# Patient Record
Sex: Male | Born: 1970
Health system: Southern US, Community
[De-identification: ages and names within clinical notes are randomized; demographics above are authoritative.]

## PROBLEM LIST (undated history)

## (undated) DIAGNOSIS — I10 Essential (primary) hypertension: Secondary | ICD-10-CM

## (undated) DIAGNOSIS — J45909 Unspecified asthma, uncomplicated: Secondary | ICD-10-CM

## (undated) HISTORY — PX: NO PAST SURGERIES: SHX2092

## (undated) HISTORY — DX: Essential (primary) hypertension: I10

## (undated) HISTORY — DX: Unspecified asthma, uncomplicated: J45.909

---

## 1999-02-28 ENCOUNTER — Emergency Department (HOSPITAL_COMMUNITY): Admission: EM | Admit: 1999-02-28 | Discharge: 1999-02-28 | Payer: Self-pay | Admitting: Emergency Medicine

## 2002-06-02 ENCOUNTER — Encounter: Payer: Self-pay | Admitting: Emergency Medicine

## 2002-06-02 ENCOUNTER — Encounter: Admission: RE | Admit: 2002-06-02 | Discharge: 2002-06-02 | Payer: Self-pay | Admitting: Emergency Medicine

## 2005-12-21 ENCOUNTER — Emergency Department (HOSPITAL_COMMUNITY): Admission: EM | Admit: 2005-12-21 | Discharge: 2005-12-21 | Payer: Self-pay | Admitting: Emergency Medicine

## 2006-04-22 ENCOUNTER — Ambulatory Visit: Payer: Self-pay | Admitting: Family Medicine

## 2007-08-02 IMAGING — CT CT MAXILLOFACIAL W/O CM
5 of 9 series · 18 of 47 positions shown, 19 images · IV contrast (agent unspecified)
Comparison: none

CLINICAL DATA: Softball injury to head and face.  Pain and swelling.  
 HEAD CT WITHOUT CONTRAST:
TECHNIQUE: Contiguous axial images were obtained from the base of the skull through the vertex according to standard protocol without contrast.
TECHNIQUE: Axial and coronal CT imaging was performed through the maxillofacial structures.  No intravenous contrast was administered.
TECHNIQUE: Multidetector CT imaging of the cervical spine was performed.  Multiplanar CT image reconstructions were also generated.

[Series 5: supine facial bones · axial · 0.33mm/px · z∈[-240,-190]mm · 2 of 80 slices shown]
[im 20/80  bone]
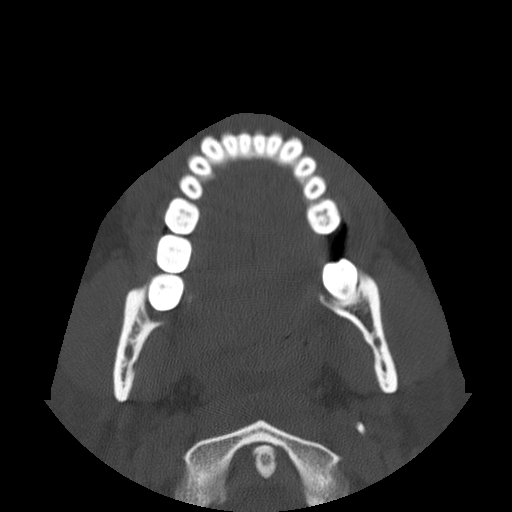
[im 40/80  bone]
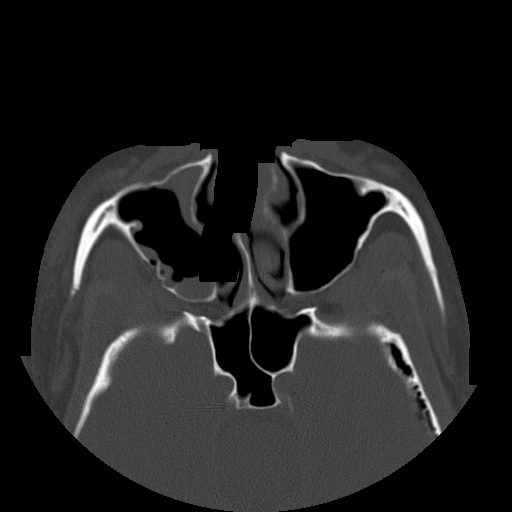

[Series 7: recon 3: supine facial bones · axial · 0.33mm/px · z∈[-264,-115]mm · 7 of 159 slices shown]
[im 20/159  bone]
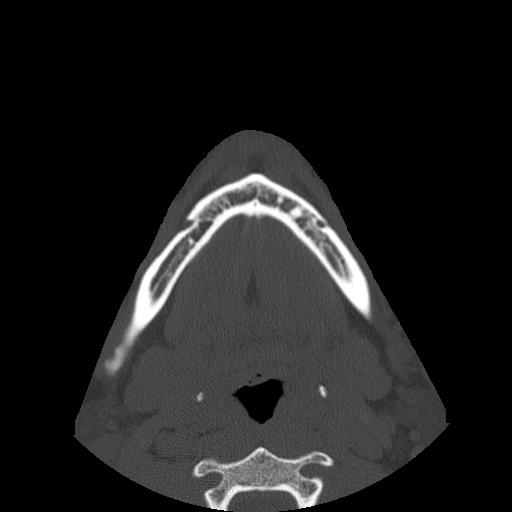
[im 40/159  bone]
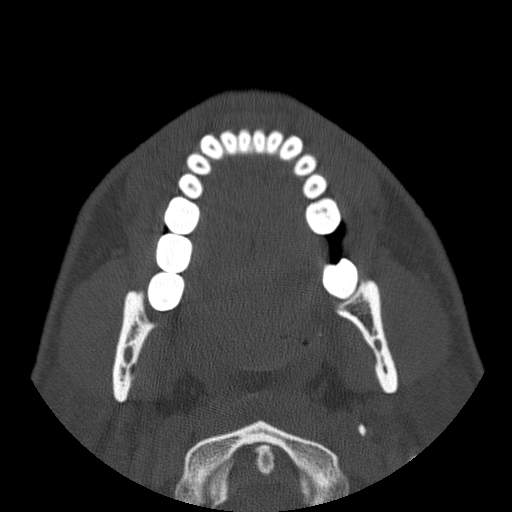
[im 60/159  bone]
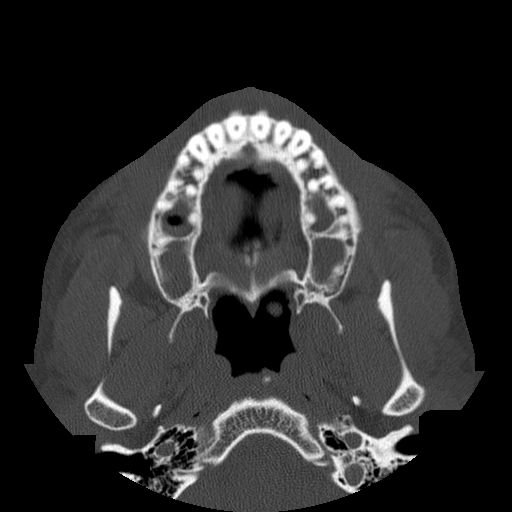
[im 80/159  bone]
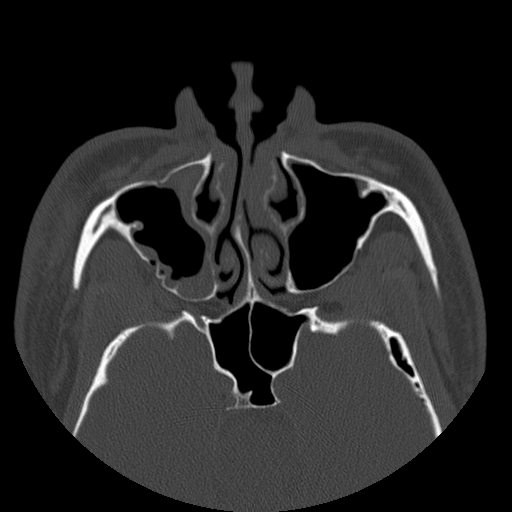
[im 99/159  bone]
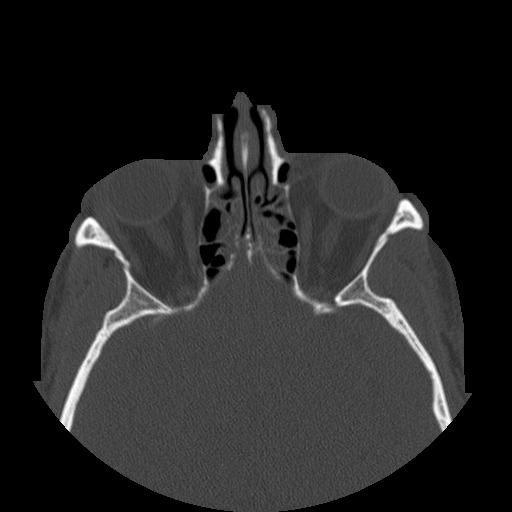
[im 119/159  bone]
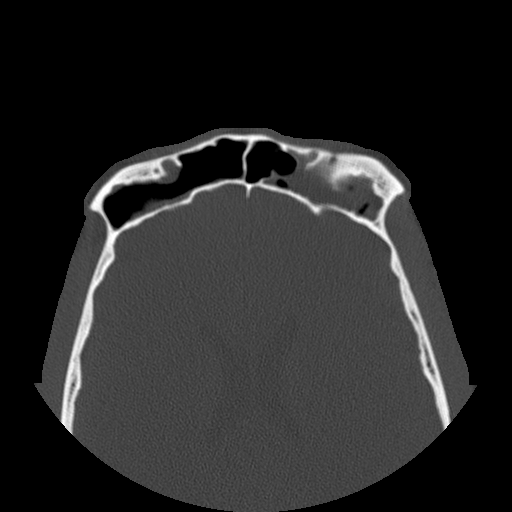
[im 139/159  bone]
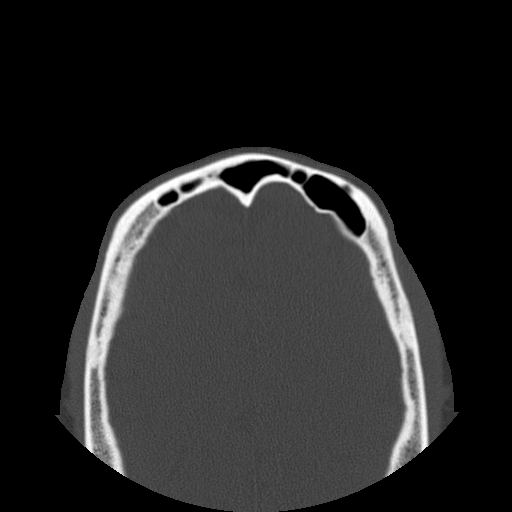

[Series 8: cervical spine · axial · 0.33mm/px · z∈[-320,-210]mm · 3 of 89 slices shown, 4 images]
[im 23/89  brain]
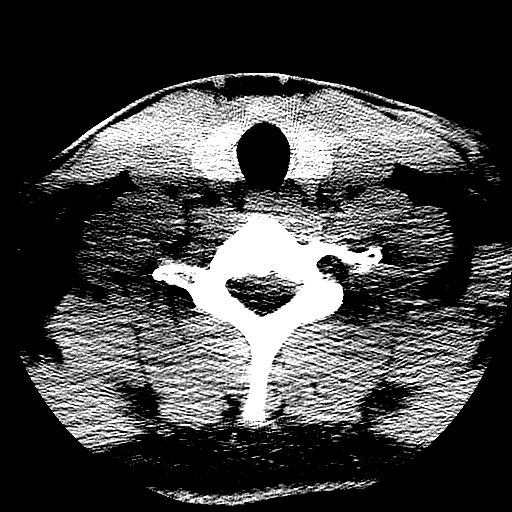
[im 23/89  bone]
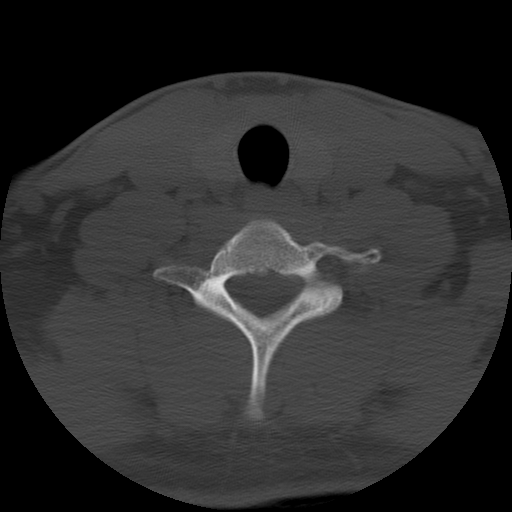
[im 45/89  bone]
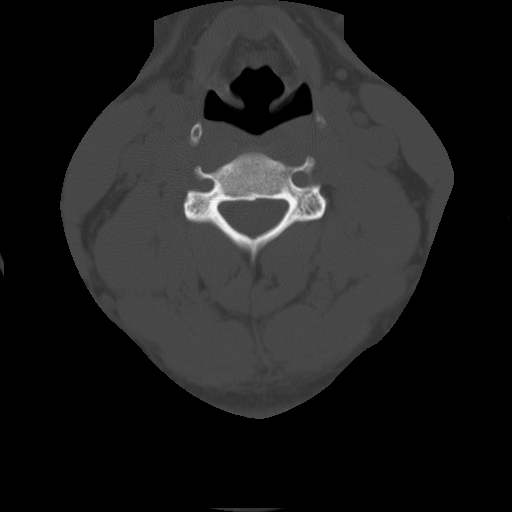
[im 67/89  bone]
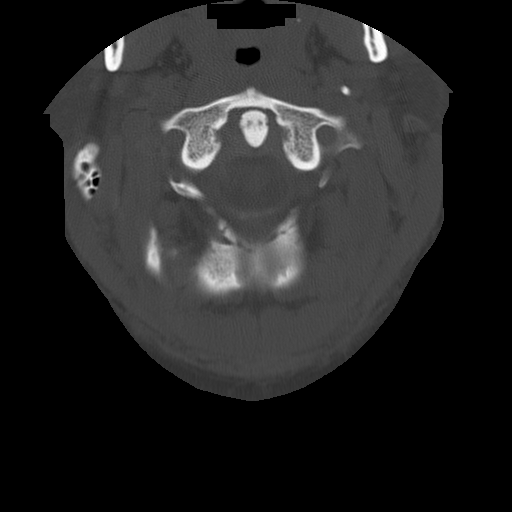

[Series 1000: reformatted · coronal · 0.43mm/px · 3 of 63 slices shown (1 of 2)]
[im 21/63  bone]
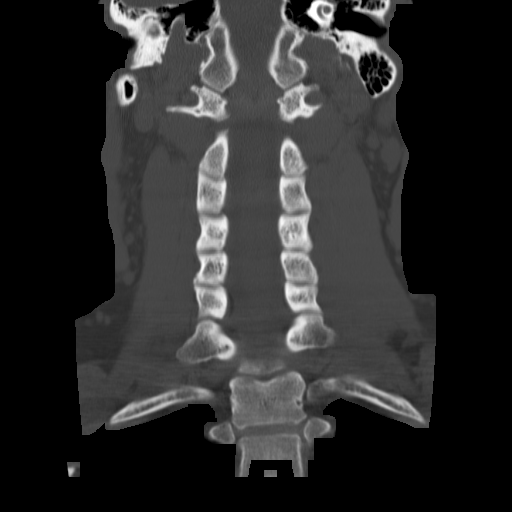
[im 28/63  bone]
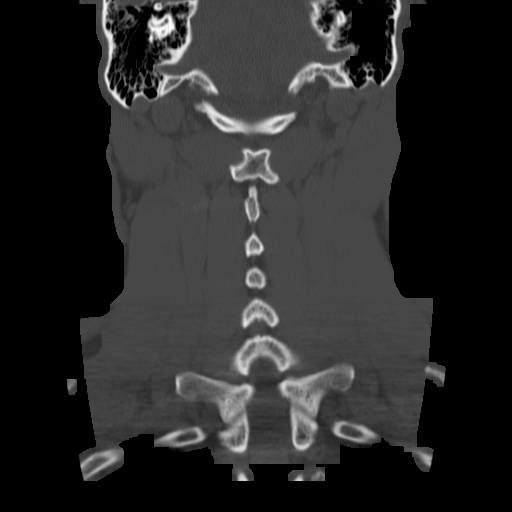
[im 35/63  bone]
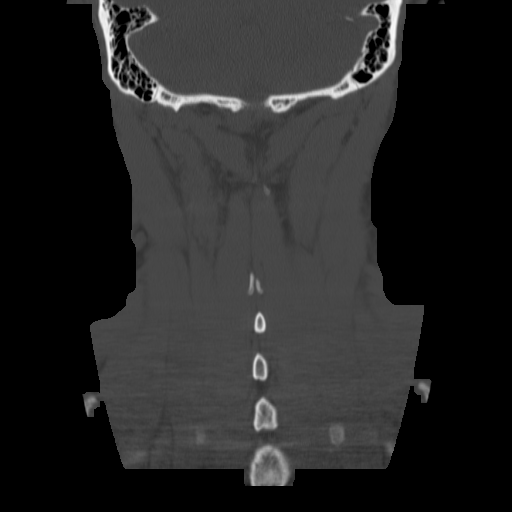

[Series 1001: reformatted · sagittal · 0.43mm/px · 3 of 63 slices shown (2 of 2)]
[im 21/63  bone]
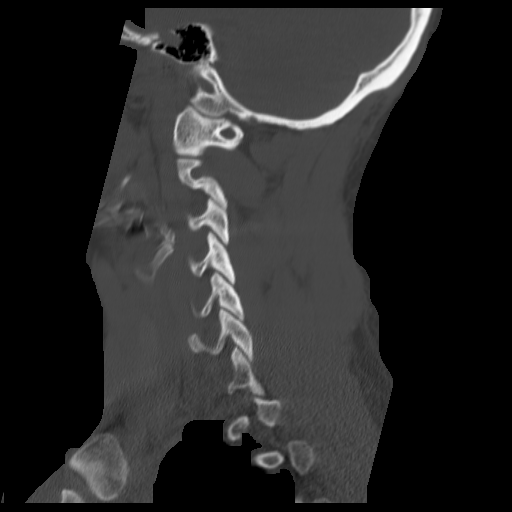
[im 32/63  bone]
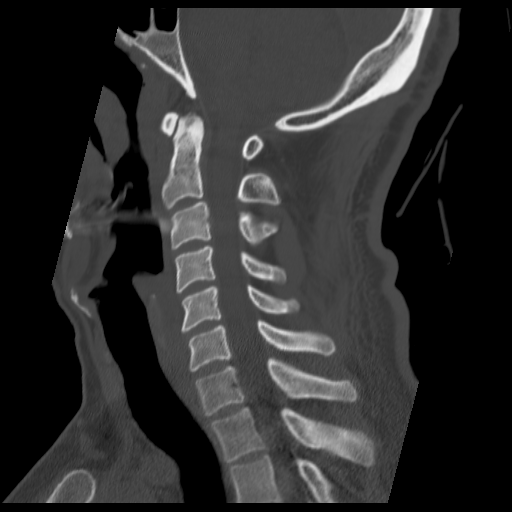
[im 42/63  bone]
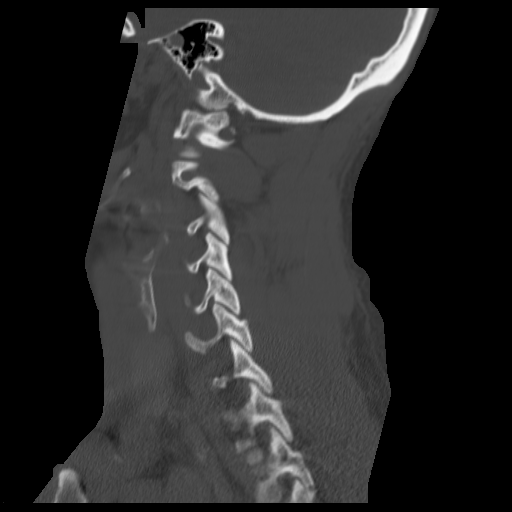

[18 of 47 positions shown; findings below may reference images not displayed]

FINDINGS: There is no evidence of intracranial hemorrhage, brain edema, acute infarct, mass lesion, or mass effect.  No other intra-axial abnormalities are seen, and the ventricles are within normal limits.  No abnormal extra-axial fluid collections or masses are identified.  No skull abnormalities are noted.
IMPRESSION: Negative non-contrast head CT.
 MAXILLOFACIAL CT WITHOUT CONTRAST:
FINDINGS: Fractures are seen involving the right zygomatic arch, lateral and anterior walls of the right maxillary sinus and the lateral wall of the right orbit consistent with a tripod fracture.  The globes and other intraorbital structures are normal in appearance.  Small amount of blood is seen within the right maxillary sinus.  Mucosal thickening is seen involving the maxillary and the ethmoid sinuses, as well as the frontal sinuses, consistent with chronic sinusitis.
IMPRESSION: 1.  Right-sided tripod fracture with involvement of the right zygomatic arch, lateral and anterior wall of the right maxillary sinus and lateral wall of the orbit.  
 2.  Chronic pansinusitis.  
 CERVICAL SPINE CT WITHOUT CONTRAST:
FINDINGS: There is no evidence of cervical spine fracture.  Spinal alignment is normal.  No other significant bone abnormalities are identified.
IMPRESSION: No evidence of cervical spine fracture or subluxation.

## 2019-10-06 ENCOUNTER — Telehealth: Payer: Self-pay

## 2019-10-06 NOTE — Telephone Encounter (Signed)
Called patient to do their pre-visit COVID screening.  Unable to do prescreening. Received an error message when trying to call patient's number.

## 2019-10-07 NOTE — Patient Instructions (Addendum)
Thank you for choosing Primary Care at [facility] to be your medical home!    Roberto Henderson was seen by Roberto Schools, DO today.   Roberto Henderson primary care provider is Roberto Myron, DO.   For the best care possible, you should try to see Roberto Myron, DO whenever you come to the clinic.   We look forward to seeing you again soon!  If you have any questions about your visit today, please call us at [phone] or feel free to reach your primary care provider via [facility].       DASH Eating Plan DASH stands for "Dietary Approaches to Stop Hypertension." The DASH eating plan is a healthy eating plan that has been shown to reduce high blood pressure (hypertension). It may also reduce your risk for type 2 diabetes, heart disease, and stroke. The DASH eating plan may also help with weight loss. What are tips for following this plan?  General guidelines  Avoid eating more than 2,300 mg (milligrams) of salt (sodium) a day. If you have hypertension, you may need to reduce your sodium intake to 1,500 mg a day.  Limit alcohol intake to no more than 1 drink a day for nonpregnant women and 2 drinks a day for men. One drink equals 12 oz of beer, 5 oz of wine, or 1 oz of hard liquor.  Work with your health care provider to maintain a healthy body weight or to lose weight. Ask what an ideal weight is for you.  Get at least 30 minutes of exercise that causes your heart to beat faster (aerobic exercise) most days of the week. Activities may include walking, swimming, or biking.  Work with your health care provider or diet and nutrition specialist (dietitian) to adjust your eating plan to your individual calorie needs. Reading food labels   Check food labels for the amount of sodium per serving. Choose foods with less than 5 percent of the Daily Value of sodium. Generally, foods with less than 300 mg of sodium per serving fit into this eating plan.  To find whole grains,  look for the word "whole" as the first word in the ingredient list. Shopping  Buy products labeled as "low-sodium" or "no salt added."  Buy fresh foods. Avoid canned foods and premade or frozen meals. Cooking  Avoid adding salt when cooking. Use salt-free seasonings or herbs instead of table salt or sea salt. Check with your health care provider or pharmacist before using salt substitutes.  Do not fry foods. Cook foods using healthy methods such as baking, boiling, grilling, and broiling instead.  Cook with heart-healthy oils, such as olive, canola, soybean, or sunflower oil. Meal planning  Eat a balanced diet that includes: ? 5 or more servings of fruits and vegetables each day. At each meal, try to fill half of your plate with fruits and vegetables. ? Up to 6-8 servings of whole grains each day. ? Less than 6 oz of lean meat, poultry, or fish each day. A 3-oz serving of meat is about the same size as a deck of cards. One egg equals 1 oz. ? 2 servings of low-fat dairy each day. ? A serving of nuts, seeds, or beans 5 times each week. ? Heart-healthy fats. Healthy fats called Omega-3 fatty acids are found in foods such as flaxseeds and coldwater fish, like sardines, salmon, and mackerel.  Limit how much you eat of the following: ? Canned or prepackaged foods. ? Food that is  high in trans fat, such as fried foods. ? Food that is high in saturated fat, such as fatty meat. ? Sweets, desserts, sugary drinks, and other foods with added sugar. ? Full-fat dairy products.  Do not salt foods before eating.  Try to eat at least 2 vegetarian meals each week.  Eat more home-cooked food and less restaurant, buffet, and fast food.  When eating at a restaurant, ask that your food be prepared with less salt or no salt, if possible. What foods are recommended? The items listed may not be a complete list. Talk with your dietitian about what dietary choices are best for you. Grains Whole-grain or  whole-wheat bread. Whole-grain or whole-wheat pasta. Brown rice. Roberto Henderson. Bulgur. Whole-grain and low-sodium cereals. Pita bread. Low-fat, low-sodium crackers. Whole-wheat flour tortillas. Vegetables Fresh or frozen vegetables (raw, steamed, roasted, or grilled). Low-sodium or reduced-sodium tomato and vegetable juice. Low-sodium or reduced-sodium tomato sauce and tomato paste. Low-sodium or reduced-sodium canned vegetables. Fruits All fresh, dried, or frozen fruit. Canned fruit in natural juice (without added sugar). Meat and other protein foods Skinless chicken or [place]. Ground chicken or [place]. Pork with fat trimmed off. Fish and seafood. Egg whites. Dried beans, peas, or lentils. Unsalted nuts, nut butters, and seeds. Unsalted canned beans. Lean cuts of beef with fat trimmed off. Low-sodium, lean deli meat. Dairy Low-fat (1%) or fat-free (skim) milk. Fat-free, low-fat, or reduced-fat cheeses. Nonfat, low-sodium ricotta or cottage cheese. Low-fat or nonfat yogurt. Low-fat, low-sodium cheese. Fats and oils Soft margarine without trans fats. Vegetable oil. Low-fat, reduced-fat, or light mayonnaise and salad dressings (reduced-sodium). Canola, safflower, olive, soybean, and sunflower oils. Avocado. Seasoning and other foods Herbs. Spices. Seasoning mixes without salt. Unsalted popcorn and pretzels. Fat-free sweets. What foods are not recommended? The items listed may not be a complete list. Talk with your dietitian about what dietary choices are best for you. Grains Baked goods made with fat, such as croissants, muffins, or some breads. Dry pasta or rice meal packs. Vegetables Creamed or fried vegetables. Vegetables in a cheese sauce. Regular canned vegetables (not low-sodium or reduced-sodium). Regular canned tomato sauce and paste (not low-sodium or reduced-sodium). Regular tomato and vegetable juice (not low-sodium or reduced-sodium). Roberto Henderson. Olives. Fruits Canned fruit in a light  or heavy syrup. Fried fruit. Fruit in cream or butter sauce. Meat and other protein foods Fatty cuts of meat. Ribs. Fried meat. Roberto Henderson. Sausage. Bologna and other processed lunch meats. Salami. Fatback. Hotdogs. Bratwurst. Salted nuts and seeds. Canned beans with added salt. Canned or smoked fish. Whole eggs or egg yolks. Chicken or [place] with skin. Dairy Whole or 2% milk, cream, and half-and-half. Whole or full-fat cream cheese. Whole-fat or sweetened yogurt. Full-fat cheese. Nondairy creamers. Whipped toppings. Processed cheese and cheese spreads. Fats and oils Butter. Stick margarine. Lard. Shortening. Ghee. Bacon fat. Tropical oils, such as coconut, palm kernel, or palm oil. Seasoning and other foods Salted popcorn and pretzels. Onion salt, garlic salt, seasoned salt, table salt, and sea salt. Worcestershire sauce. Tartar sauce. Barbecue sauce. Teriyaki sauce. Soy sauce, including reduced-sodium. Steak sauce. Canned and packaged gravies. Fish sauce. Oyster sauce. Cocktail sauce. Horseradish that you find on the shelf. Ketchup. Mustard. Meat flavorings and tenderizers. Bouillon cubes. Hot sauce and Tabasco sauce. Premade or packaged marinades. Premade or packaged taco seasonings. Relishes. Regular salad dressings. Where to find more information:  National Heart, Lung, and [place]: https://wilson-eaton.com/  American Heart Association: www.heart.org Summary  The DASH eating plan is a healthy eating  plan that has been shown to reduce high blood pressure (hypertension). It may also reduce your risk for type 2 diabetes, heart disease, and stroke.  With the DASH eating plan, you should limit salt (sodium) intake to 2,300 mg a day. If you have hypertension, you may need to reduce your sodium intake to 1,500 mg a day.  When on the DASH eating plan, aim to eat more fresh fruits and vegetables, whole grains, lean proteins, low-fat dairy, and heart-healthy fats.  Work with your health care provider or  diet and nutrition specialist (dietitian) to adjust your eating plan to your individual calorie needs. This information is not intended to replace advice given to you by your health care provider. Make sure you discuss any questions you have with your health care provider. Document Revised: 04/11/2017 Document Reviewed: 04/22/2016 Elsevier Patient Education  2020 [employer].

## 2019-10-08 ENCOUNTER — Other Ambulatory Visit: Payer: Self-pay

## 2019-10-08 ENCOUNTER — Ambulatory Visit (INDEPENDENT_AMBULATORY_CARE_PROVIDER_SITE_OTHER): Payer: 59 | Admitting: Internal Medicine

## 2019-10-08 ENCOUNTER — Encounter: Payer: Self-pay | Admitting: Internal Medicine

## 2019-10-08 VITALS — BP 210/138 | HR 103 | Temp 97.3°F | Resp 17 | Ht 67.0 in | Wt 219.0 lb

## 2019-10-08 DIAGNOSIS — L989 Disorder of the skin and subcutaneous tissue, unspecified: Secondary | ICD-10-CM

## 2019-10-08 DIAGNOSIS — Z7689 Persons encountering health services in other specified circumstances: Secondary | ICD-10-CM

## 2019-10-08 DIAGNOSIS — I10 Essential (primary) hypertension: Secondary | ICD-10-CM

## 2019-10-08 DIAGNOSIS — Z114 Encounter for screening for human immunodeficiency virus [HIV]: Secondary | ICD-10-CM

## 2019-10-08 DIAGNOSIS — Z125 Encounter for screening for malignant neoplasm of prostate: Secondary | ICD-10-CM

## 2019-10-08 DIAGNOSIS — Z13220 Encounter for screening for lipoid disorders: Secondary | ICD-10-CM

## 2019-10-08 DIAGNOSIS — Z1211 Encounter for screening for malignant neoplasm of colon: Secondary | ICD-10-CM

## 2019-10-08 MED ORDER — CLONIDINE HCL 0.1 MG PO TABS
0.2000 mg | ORAL_TABLET | Freq: Once | ORAL | Status: AC
  Administered 2019-10-08: 0.2 mg via ORAL

## 2019-10-08 MED ORDER — AMLODIPINE BESYLATE 10 MG PO TABS: 10.0000 mg | ORAL_TABLET | Freq: Every day | ORAL | 3 refills | Status: DC

## 2019-10-08 NOTE — Progress Notes (Signed)
  Subjective:    Roberto Henderson - 49 y.o. male MRN [id number]  Date of birth: [birth year]  HPI  Roberto Henderson is to establish care. Patient has a PMH significant for HTN--was on medications in remote past. Has not seen a doctor for about 15 years. He denies any current headache, chest pain, vision changes.     ROS per HPI     Health Maintenance:  Health Maintenance Due  Topic Date Due  . HIV Screening  Never done  . TETANUS/TDAP  Never done  . COLONOSCOPY  Never done     Past Medical History: There are no problems to display for this patient.     Social History   reports that he has never smoked. He has never used smokeless tobacco. He reports current alcohol use.   Family History  family history is not on file.   Medications: reviewed and updated   Objective:   Physical Exam BP (!) 220/146   Pulse (!) 103   Temp (!) 97.3 F (36.3 C) (Temporal)   Resp 17   Ht 5\' 7"  (1.702 m)   Wt 219 lb (99.3 kg)   SpO2 97%   BMI 34.30 kg/m  Physical Exam  Constitutional: He is oriented to person, place, and time and well-developed, well-nourished, and in no distress. No distress.  HENT:  Head: Normocephalic and atraumatic.  Eyes: Conjunctivae and EOM are normal.  Cardiovascular: Normal rate, regular rhythm and normal heart sounds.  No murmur heard. Pulmonary/Chest: Effort normal and breath sounds normal. No respiratory distress.  Musculoskeletal:        General: Normal range of motion.  Neurological: He is alert and oriented to person, place, and time.  Skin: Skin is warm and dry. He is not diaphoretic.  Large approximately 2 x1.5 cm hyperpigmented, raised, textured skin lesion present on left lower arm.   Psychiatric: Affect and judgment normal.        Assessment & Plan:    1. Encounter to establish care Reviewed patient's PMH, social history, surgical history, and medications.    2. Essential hypertension BP is significantly elevated. Patient currently  asymptomatic. Given Clonidine in office. Started on Amlodipine. Discussed importance of compliance and potential side effects. Discussed that untreated HTN can lead to a variety of medical problems including kidney disease, retinopathy, CVA, MI, etc. Discussed that he will likely need more than one agent to achieve BP control. If he develops any concerning symptoms he is to present to ED immediately.  Counseled on blood pressure goal of less than 130/80, low-sodium, DASH diet, medication compliance, 150 minutes of moderate intensity exercise per week. Discussed medication compliance, adverse effects. - CBC with Differential - Comprehensive metabolic panel - Lipid Panel - amLODipine (NORVASC) 10 MG tablet; Take 1 tablet (10 mg total) by mouth daily.  Dispense: 90 tablet; Refill: 3 - cloNIDine (CATAPRES) tablet 0.2 mg  3. Lipid screening - Lipid Panel  4. Screening PSA (prostate specific antigen) Discussed risks and benefits of screening. Patient opted in to prostate cancer screening.  - PSA  5. Colon cancer screening - Ambulatory referral to Gastroenterology  6. Screening for HIV (human immunodeficiency virus) - HIV antibody (with reflex)  7. Skin lesion Needs removal and to be sent to pathology.  - Ambulatory referral to Dermatology    Phill Myron, D.O. 10/08/2019, 8:59 AM Primary Care at [facility]

## 2019-10-09 LAB — CBC WITH DIFFERENTIAL/PLATELET
Basophils Absolute: 0.1 10*3/uL (ref 0.0–0.2)
Basos: 1 %
EOS (ABSOLUTE): 0.4 10*3/uL (ref 0.0–0.4)
Eos: 5 %
Hematocrit: 45.8 % (ref 37.5–51.0)
Hemoglobin: 14.9 g/dL (ref 13.0–17.7)
Immature Grans (Abs): 0 10*3/uL (ref 0.0–0.1)
Immature Granulocytes: 1 %
Lymphocytes Absolute: 1.9 10*3/uL (ref 0.7–3.1)
Lymphs: 25 %
MCH: 28 pg (ref 26.6–33.0)
MCHC: 32.5 g/dL (ref 31.5–35.7)
MCV: 86 fL (ref 79–97)
Monocytes Absolute: 0.6 10*3/uL (ref 0.1–0.9)
Monocytes: 7 %
Neutrophils Absolute: 4.8 10*3/uL (ref 1.4–7.0)
Neutrophils: 61 %
Platelets: 320 10*3/uL (ref 150–450)
RBC: 5.32 x10E6/uL (ref 4.14–5.80)
RDW: 13.1 % (ref 11.6–15.4)
WBC: 7.8 10*3/uL (ref 3.4–10.8)

## 2019-10-09 LAB — COMPREHENSIVE METABOLIC PANEL
ALT: 24 IU/L (ref 0–44)
AST: 19 IU/L (ref 0–40)
Albumin/Globulin Ratio: 1.7 (ref 1.2–2.2)
Albumin: 4.7 g/dL (ref 4.0–5.0)
Alkaline Phosphatase: 82 IU/L (ref 48–121)
BUN/Creatinine Ratio: 12 (ref 9–20)
BUN: 19 mg/dL (ref 6–24)
Bilirubin Total: 0.5 mg/dL (ref 0.0–1.2)
CO2: 25 mmol/L (ref 20–29)
Calcium: 9.5 mg/dL (ref 8.7–10.2)
Chloride: 105 mmol/L (ref 96–106)
Creatinine, Ser: 1.58 mg/dL — ABNORMAL HIGH (ref 0.76–1.27)
GFR calc Af Amer: 58 mL/min/{1.73_m2} — ABNORMAL LOW (ref >59)
GFR calc non Af Amer: 50 mL/min/{1.73_m2} — ABNORMAL LOW (ref >59)
Globulin, Total: 2.8 g/dL (ref 1.5–4.5)
Glucose: 87 mg/dL (ref 65–99)
Potassium: 4 mmol/L (ref 3.5–5.2)
Sodium: 146 mmol/L — ABNORMAL HIGH (ref 134–144)
Total Protein: 7.5 g/dL (ref 6.0–8.5)

## 2019-10-09 LAB — LIPID PANEL
Chol/HDL Ratio: 3.2 ratio (ref 0.0–5.0)
Cholesterol, Total: 145 mg/dL (ref 100–199)
HDL: 46 mg/dL (ref >39)
LDL Chol Calc (NIH): 88 mg/dL (ref 0–99)
Triglycerides: 54 mg/dL (ref 0–149)
VLDL Cholesterol Cal: 11 mg/dL (ref 5–40)

## 2019-10-09 LAB — PSA: Prostate Specific Ag, Serum: 1.6 ng/mL (ref 0.0–4.0)

## 2019-10-09 LAB — HIV ANTIBODY (ROUTINE TESTING W REFLEX): HIV Screen 4th Generation wRfx: NONREACTIVE

## 2019-10-13 ENCOUNTER — Other Ambulatory Visit: Payer: Self-pay | Admitting: Internal Medicine

## 2019-10-13 DIAGNOSIS — R7989 Other specified abnormal findings of blood chemistry: Secondary | ICD-10-CM

## 2019-10-13 DIAGNOSIS — Z9189 Other specified personal risk factors, not elsewhere classified: Secondary | ICD-10-CM

## 2019-10-13 DIAGNOSIS — E782 Mixed hyperlipidemia: Secondary | ICD-10-CM | POA: Insufficient documentation

## 2019-10-13 MED ORDER — ATORVASTATIN CALCIUM 40 MG PO TABS: 40.0000 mg | ORAL_TABLET | Freq: Every day | ORAL | 3 refills | Status: DC

## 2019-10-13 MED ORDER — ASPIRIN 81 MG PO TBEC: 81.0000 mg | DELAYED_RELEASE_TABLET | Freq: Every day | ORAL | 12 refills | Status: DC

## 2019-11-12 ENCOUNTER — Encounter: Payer: Self-pay | Admitting: Internal Medicine

## 2019-11-12 ENCOUNTER — Other Ambulatory Visit: Payer: Self-pay

## 2019-11-12 ENCOUNTER — Ambulatory Visit (INDEPENDENT_AMBULATORY_CARE_PROVIDER_SITE_OTHER): Payer: 59 | Admitting: Internal Medicine

## 2019-11-12 VITALS — BP 177/114 | HR 109 | Temp 97.5°F | Resp 17 | Wt 222.0 lb

## 2019-11-12 DIAGNOSIS — Z9189 Other specified personal risk factors, not elsewhere classified: Secondary | ICD-10-CM | POA: Diagnosis not present

## 2019-11-12 DIAGNOSIS — L989 Disorder of the skin and subcutaneous tissue, unspecified: Secondary | ICD-10-CM

## 2019-11-12 DIAGNOSIS — I10 Essential (primary) hypertension: Secondary | ICD-10-CM

## 2019-11-12 DIAGNOSIS — Z1159 Encounter for screening for other viral diseases: Secondary | ICD-10-CM

## 2019-11-12 MED ORDER — ATORVASTATIN CALCIUM 40 MG PO TABS: 40.0000 mg | ORAL_TABLET | Freq: Every day | ORAL | 3 refills | Status: DC

## 2019-11-12 MED ORDER — HYDROCHLOROTHIAZIDE 25 MG PO TABS: 25.0000 mg | ORAL_TABLET | Freq: Every day | ORAL | 3 refills | Status: DC

## 2019-11-12 NOTE — Progress Notes (Signed)
  Subjective:    Roberto Henderson - 49 y.o. male MRN [id number]  Date of birth: 08-02-1970  HPI  Roberto Henderson is here for HTN f/u.  Chronic HTN Disease Monitoring:  Home BP Monitoring - 160/90s  Chest pain- no  Dyspnea- no Headache - no  Medications: Amlodipine 10 mg  Compliance- yes Lightheadedness- no  Edema- no   Health Maintenance:  Health Maintenance Due  Topic Date Due  . Hepatitis C Screening  Never done  . COLONOSCOPY  Never done    -  reports that he has never smoked. He has never used smokeless tobacco. - Review of Systems: Per HPI. - Past Medical History: Patient Active Problem List   Diagnosis Date Noted  . Candidate for statin therapy due to risk of future cardiovascular event 10/13/2019  . Essential hypertension 10/08/2019   - Medications: reviewed and updated   Objective:   Physical Exam BP (!) 177/114   Pulse (!) 109   Temp (!) 97.5 F (36.4 C) (Temporal)   Resp 17   Wt 222 lb (100.7 kg)   SpO2 97%   BMI 34.77 kg/m  Physical Exam Constitutional:      General: He is not in acute distress.    Appearance: He is not diaphoretic.  HENT:     Head: Normocephalic and atraumatic.  Eyes:     Conjunctiva/sclera: Conjunctivae normal.  Cardiovascular:     Rate and Rhythm: Normal rate and regular rhythm.     Heart sounds: Normal heart sounds. No murmur heard.   Pulmonary:     Effort: Pulmonary effort is normal. No respiratory distress.     Breath sounds: Normal breath sounds.  Musculoskeletal:        General: Normal range of motion.  Skin:    General: Skin is warm and dry.  Neurological:     Mental Status: He is alert and oriented to person, place, and time.  Psychiatric:        Mood and Affect: Affect normal.        Judgment: Judgment normal.            Assessment & Plan:   1. Essential hypertension BP above goal. Asymptomatic.  Will add HCTZ. Return within 4 weeks for lab monitoring and BP follow up.  Counseled on  blood pressure goal of less than 130/80, low-sodium, DASH diet, medication compliance, 150 minutes of moderate intensity exercise per week. Discussed medication compliance, adverse effects. - hydrochlorothiazide (HYDRODIURIL) 25 MG tablet; Take 1 tablet (25 mg total) by mouth daily.  Dispense: 90 tablet; Refill: 3  2. Need for hepatitis C screening test - Hepatitis C Antibody  3. Candidate for statin therapy due to risk of future cardiovascular event Discussed with patient that his ASCVD risk score is almost 20%. He is candidate for statin and ASA therapy to lower this risk.  - atorvastatin (LIPITOR) 40 MG tablet; Take 1 tablet (40 mg total) by mouth daily.  Dispense: 90 tablet; Refill: 3  4. Skin lesion - Ambulatory referral to Dermatology     Phill Myron, D.O. 11/12/2019, 9:18 AM Primary Care at [facility]

## 2019-11-12 NOTE — Patient Instructions (Addendum)
I would recommend also adding an 81 mg baby aspirin to your daily medication regimen to prevent heart attacks or strokes.    [facility]'s phone number is [phone]. Please give them a call to set up your colonoscopy.

## 2019-11-13 LAB — HEPATITIS C ANTIBODY: Hep C Virus Ab: <0.1 s/co ratio (ref 0.0–0.9)

## 2019-11-16 ENCOUNTER — Encounter: Payer: Self-pay | Admitting: Gastroenterology

## 2019-12-23 ENCOUNTER — Telehealth: Payer: Self-pay

## 2019-12-23 NOTE — Telephone Encounter (Signed)
Called patient to do their pre-visit COVID screening.  Call went to voicemail. Unable to do prescreening.  

## 2019-12-24 ENCOUNTER — Encounter: Payer: Self-pay | Admitting: Internal Medicine

## 2019-12-24 ENCOUNTER — Other Ambulatory Visit: Payer: Self-pay

## 2019-12-24 ENCOUNTER — Ambulatory Visit (INDEPENDENT_AMBULATORY_CARE_PROVIDER_SITE_OTHER): Payer: 59 | Admitting: Internal Medicine

## 2019-12-24 VITALS — BP 167/99 | HR 100 | Temp 97.5°F | Resp 17 | Wt 215.0 lb

## 2019-12-24 DIAGNOSIS — Z0289 Encounter for other administrative examinations: Secondary | ICD-10-CM

## 2019-12-24 DIAGNOSIS — R7989 Other specified abnormal findings of blood chemistry: Secondary | ICD-10-CM | POA: Diagnosis not present

## 2019-12-24 DIAGNOSIS — I10 Essential (primary) hypertension: Secondary | ICD-10-CM | POA: Diagnosis not present

## 2019-12-24 MED ORDER — LOSARTAN POTASSIUM 50 MG PO TABS: 50.0000 mg | ORAL_TABLET | Freq: Every day | ORAL | 3 refills | Status: DC

## 2019-12-24 NOTE — Progress Notes (Signed)
°  Subjective:    Roberto Henderson - 49 y.o. male MRN [id number]  Date of birth: 02-10-1971  HPI  Roberto Henderson is here for HTN f/u.  Chronic HTN Disease Monitoring:  Home BP Monitoring - 150-160/80s  Chest pain- no  Dyspnea- no Headache - no  Medications: Amlodipine 10 mg, HCTZ 25 mg  Compliance- yes Lightheadedness- no  Edema- no     Health Maintenance:  Health Maintenance Due  Topic Date Due   COVID-19 Vaccine (1) Never done   COLONOSCOPY  Never done   INFLUENZA VACCINE  12/12/2019    -  reports that he has never smoked. He has never used smokeless tobacco. - Review of Systems: Per HPI. - Past Medical History: Patient Active Problem List   Diagnosis Date Noted   Candidate for statin therapy due to risk of future cardiovascular event 10/13/2019   Essential hypertension 10/08/2019   - Medications: reviewed and updated   Objective:   Physical Exam BP (!) 167/99    Pulse 100    Temp (!) 97.5 F (36.4 C) (Temporal)    Resp 17    Wt 215 lb (97.5 kg)    SpO2 97%    BMI 33.67 kg/m  Physical Exam Constitutional:      General: He is not in acute distress.    Appearance: He is not diaphoretic.  Cardiovascular:     Rate and Rhythm: Normal rate.  Pulmonary:     Effort: Pulmonary effort is normal. No respiratory distress.  Musculoskeletal:        General: Normal range of motion.  Skin:    General: Skin is warm and dry.  Neurological:     Mental Status: He is alert and oriented to person, place, and time.  Psychiatric:        Mood and Affect: Affect normal.        Judgment: Judgment normal.            Assessment & Plan:   1. Essential hypertension BP continues to be above goal, slightly improved. Will add Losartan. Return in 2 weeks for BP monitoring and lab monitoring with new medication. Elicited that may have some symptoms concerning for OSA. If not improving with third agent would consider sleep study due to symptoms, elevated BMI, and  difficult to control BP.  Counseled on blood pressure goal of less than 130/80, low-sodium, DASH diet, medication compliance, 150 minutes of moderate intensity exercise per week. Discussed medication compliance, adverse effects. - losartan (COZAAR) 50 MG tablet; Take 1 tablet (50 mg total) by mouth daily.  Dispense: 90 tablet; Refill: 3 - Basic metabolic panel - Basic metabolic panel; Future  2. Encounter for completion of form with patient Patient asking for FMLA form to be completed. Will have patient have work place fax appropriate form. Will need intermittent leave to for office visits for HTN management as well as upcoming GI visits for colonoscopy and derm visit for skin lesion.  - Basic metabolic panel  3. Elevated serum creatinine Noted to have Cr 1.58 in May 2021 with no lab available for comparison. Repeat today to determine if has CKD.  - Basic metabolic panel    Phill Myron, D.O. 12/24/2019, 9:02 AM Primary Care at [facility]

## 2019-12-24 NOTE — Patient Instructions (Signed)
DASH Eating Plan DASH stands for "Dietary Approaches to Stop Hypertension." The DASH eating plan is a healthy eating plan that has been shown to reduce high blood pressure (hypertension). It may also reduce your risk for type 2 diabetes, heart disease, and stroke. The DASH eating plan may also help with weight loss. What are tips for following this plan?  General guidelines  Avoid eating more than 2,300 mg (milligrams) of salt (sodium) a day. If you have hypertension, you may need to reduce your sodium intake to 1,500 mg a day.  Limit alcohol intake to no more than 1 drink a day for nonpregnant women and 2 drinks a day for men. One drink equals 12 oz of beer, 5 oz of wine, or 1 oz of hard liquor.  Work with your health care provider to maintain a healthy body weight or to lose weight. Ask what an ideal weight is for you.  Get at least 30 minutes of exercise that causes your heart to beat faster (aerobic exercise) most days of the week. Activities may include walking, swimming, or biking.  Work with your health care provider or diet and nutrition specialist (dietitian) to adjust your eating plan to your individual calorie needs. Reading food labels   Check food labels for the amount of sodium per serving. Choose foods with less than 5 percent of the Daily Value of sodium. Generally, foods with less than 300 mg of sodium per serving fit into this eating plan.  To find whole grains, look for the word "whole" as the first word in the ingredient list. Shopping  Buy products labeled as "low-sodium" or "no salt added."  Buy fresh foods. Avoid canned foods and premade or frozen meals. Cooking  Avoid adding salt when cooking. Use salt-free seasonings or herbs instead of table salt or sea salt. Check with your health care provider or pharmacist before using salt substitutes.  Do not fry foods. Cook foods using healthy methods such as baking, boiling, grilling, and broiling instead.  Cook with  heart-healthy oils, such as olive, canola, soybean, or sunflower oil. Meal planning  Eat a balanced diet that includes: ? 5 or more servings of fruits and vegetables each day. At each meal, try to fill half of your plate with fruits and vegetables. ? Up to 6-8 servings of whole grains each day. ? Less than 6 oz of lean meat, poultry, or fish each day. A 3-oz serving of meat is about the same size as a deck of cards. One egg equals 1 oz. ? 2 servings of low-fat dairy each day. ? A serving of nuts, seeds, or beans 5 times each week. ? Heart-healthy fats. Healthy fats called Omega-3 fatty acids are found in foods such as flaxseeds and coldwater fish, like sardines, salmon, and mackerel.  Limit how much you eat of the following: ? Canned or prepackaged foods. ? Food that is high in trans fat, such as fried foods. ? Food that is high in saturated fat, such as fatty meat. ? Sweets, desserts, sugary drinks, and other foods with added sugar. ? Full-fat dairy products.  Do not salt foods before eating.  Try to eat at least 2 vegetarian meals each week.  Eat more home-cooked food and less restaurant, buffet, and fast food.  When eating at a restaurant, ask that your food be prepared with less salt or no salt, if possible. What foods are recommended? The items listed may not be a complete list. Talk with your dietitian about   what dietary choices are best for you. Grains Whole-grain or whole-wheat bread. Whole-grain or whole-wheat pasta. Brown rice. Oatmeal. Quinoa. Bulgur. Whole-grain and low-sodium cereals. Pita bread. Low-fat, low-sodium crackers. Whole-wheat flour tortillas. Vegetables Fresh or frozen vegetables (raw, steamed, roasted, or grilled). Low-sodium or reduced-sodium tomato and vegetable juice. Low-sodium or reduced-sodium tomato sauce and tomato paste. Low-sodium or reduced-sodium canned vegetables. Fruits All fresh, dried, or frozen fruit. Canned fruit in natural juice (without  added sugar). Meat and other protein foods Skinless chicken or turkey. Ground chicken or turkey. Pork with fat trimmed off. Fish and seafood. Egg whites. Dried beans, peas, or lentils. Unsalted nuts, nut butters, and seeds. Unsalted canned beans. Lean cuts of beef with fat trimmed off. Low-sodium, lean deli meat. Dairy Low-fat (1%) or fat-free (skim) milk. Fat-free, low-fat, or reduced-fat cheeses. Nonfat, low-sodium ricotta or cottage cheese. Low-fat or nonfat yogurt. Low-fat, low-sodium cheese. Fats and oils Soft margarine without trans fats. Vegetable oil. Low-fat, reduced-fat, or light mayonnaise and salad dressings (reduced-sodium). Canola, safflower, olive, soybean, and sunflower oils. Avocado. Seasoning and other foods Herbs. Spices. Seasoning mixes without salt. Unsalted popcorn and pretzels. Fat-free sweets. What foods are not recommended? The items listed may not be a complete list. Talk with your dietitian about what dietary choices are best for you. Grains Baked goods made with fat, such as croissants, muffins, or some breads. Dry pasta or rice meal packs. Vegetables Creamed or fried vegetables. Vegetables in a cheese sauce. Regular canned vegetables (not low-sodium or reduced-sodium). Regular canned tomato sauce and paste (not low-sodium or reduced-sodium). Regular tomato and vegetable juice (not low-sodium or reduced-sodium). Pickles. Olives. Fruits Canned fruit in a light or heavy syrup. Fried fruit. Fruit in cream or butter sauce. Meat and other protein foods Fatty cuts of meat. Ribs. Fried meat. Bacon. Sausage. Bologna and other processed lunch meats. Salami. Fatback. Hotdogs. Bratwurst. Salted nuts and seeds. Canned beans with added salt. Canned or smoked fish. Whole eggs or egg yolks. Chicken or turkey with skin. Dairy Whole or 2% milk, cream, and half-and-half. Whole or full-fat cream cheese. Whole-fat or sweetened yogurt. Full-fat cheese. Nondairy creamers. Whipped toppings.  Processed cheese and cheese spreads. Fats and oils Butter. Stick margarine. Lard. Shortening. Ghee. Bacon fat. Tropical oils, such as coconut, palm kernel, or palm oil. Seasoning and other foods Salted popcorn and pretzels. Onion salt, garlic salt, seasoned salt, table salt, and sea salt. Worcestershire sauce. Tartar sauce. Barbecue sauce. Teriyaki sauce. Soy sauce, including reduced-sodium. Steak sauce. Canned and packaged gravies. Fish sauce. Oyster sauce. Cocktail sauce. Horseradish that you find on the shelf. Ketchup. Mustard. Meat flavorings and tenderizers. Bouillon cubes. Hot sauce and Tabasco sauce. Premade or packaged marinades. Premade or packaged taco seasonings. Relishes. Regular salad dressings. Where to find more information:  National Heart, Lung, and Blood Institute: www.nhlbi.nih.gov  American Heart Association: www.heart.org Summary  The DASH eating plan is a healthy eating plan that has been shown to reduce high blood pressure (hypertension). It may also reduce your risk for type 2 diabetes, heart disease, and stroke.  With the DASH eating plan, you should limit salt (sodium) intake to 2,300 mg a day. If you have hypertension, you may need to reduce your sodium intake to 1,500 mg a day.  When on the DASH eating plan, aim to eat more fresh fruits and vegetables, whole grains, lean proteins, low-fat dairy, and heart-healthy fats.  Work with your health care provider or diet and nutrition specialist (dietitian) to adjust your eating plan to your   individual calorie needs. This information is not intended to replace advice given to you by your health care provider. Make sure you discuss any questions you have with your health care provider. Document Revised: 04/11/2017 Document Reviewed: 04/22/2016 Elsevier Patient Education  2020 Elsevier Inc.  

## 2019-12-25 LAB — BASIC METABOLIC PANEL
BUN/Creatinine Ratio: 10 (ref 9–20)
BUN: 14 mg/dL (ref 6–24)
CO2: 28 mmol/L (ref 20–29)
Calcium: 9.5 mg/dL (ref 8.7–10.2)
Chloride: 101 mmol/L (ref 96–106)
Creatinine, Ser: 1.45 mg/dL — ABNORMAL HIGH (ref 0.76–1.27)
GFR calc Af Amer: 64 mL/min/{1.73_m2} (ref >59)
GFR calc non Af Amer: 56 mL/min/{1.73_m2} — ABNORMAL LOW (ref >59)
Glucose: 101 mg/dL — ABNORMAL HIGH (ref 65–99)
Potassium: 3.2 mmol/L — ABNORMAL LOW (ref 3.5–5.2)
Sodium: 144 mmol/L (ref 134–144)

## 2019-12-27 ENCOUNTER — Other Ambulatory Visit: Payer: Self-pay | Admitting: Family Medicine

## 2019-12-27 MED ORDER — POTASSIUM CHLORIDE CRYS ER 10 MEQ PO TBCR: 10.0000 meq | EXTENDED_RELEASE_TABLET | Freq: Every day | ORAL | 0 refills | Status: DC

## 2020-01-05 ENCOUNTER — Encounter: Payer: Self-pay | Admitting: Gastroenterology

## 2020-01-05 ENCOUNTER — Ambulatory Visit (AMBULATORY_SURGERY_CENTER): Payer: Self-pay | Admitting: *Deleted

## 2020-01-05 ENCOUNTER — Other Ambulatory Visit: Payer: Self-pay

## 2020-01-05 VITALS — Ht 67.0 in | Wt 224.0 lb

## 2020-01-05 DIAGNOSIS — Z1211 Encounter for screening for malignant neoplasm of colon: Secondary | ICD-10-CM

## 2020-01-05 MED ORDER — PLENVU 140 G PO SOLR: 1.0000 | Freq: Once | ORAL | 0 refills | Status: DC

## 2020-01-05 MED ORDER — PLENVU 140 G PO SOLR: 1.0000 | Freq: Once | ORAL | 0 refills | Status: AC

## 2020-01-05 NOTE — Progress Notes (Signed)
Patient is here in-person for PV. Patient denies any allergies to eggs or soy. Patient denies any past surgeries. Patient denies any oxygen use at home. Patient denies taking any diet/weight loss medications or blood thinners. Patient is not being treated for MRSA or C-diff. Patient is aware of our care-partner policy and HKUVJ-50 safety protocol. EMMI education sent to [employer]. COVID-19 vaccines completed in May 2021 per pt. plenvu Prep Prescription coupon given to the patient.

## 2020-01-07 ENCOUNTER — Ambulatory Visit (INDEPENDENT_AMBULATORY_CARE_PROVIDER_SITE_OTHER): Payer: 59

## 2020-01-07 ENCOUNTER — Other Ambulatory Visit: Payer: Self-pay

## 2020-01-07 VITALS — BP 153/93

## 2020-01-07 DIAGNOSIS — R7989 Other specified abnormal findings of blood chemistry: Secondary | ICD-10-CM

## 2020-01-07 DIAGNOSIS — I10 Essential (primary) hypertension: Secondary | ICD-10-CM | POA: Diagnosis not present

## 2020-01-07 NOTE — Progress Notes (Signed)
Patient here for BP check. Reading was 153/93. Advised to follow up with provider for BP f/up.

## 2020-01-08 LAB — BASIC METABOLIC PANEL
BUN/Creatinine Ratio: 13 (ref 9–20)
BUN: 15 mg/dL (ref 6–24)
CO2: 27 mmol/L (ref 20–29)
Calcium: 9.7 mg/dL (ref 8.7–10.2)
Chloride: 102 mmol/L (ref 96–106)
Creatinine, Ser: 1.18 mg/dL (ref 0.76–1.27)
GFR calc Af Amer: 83 mL/min/{1.73_m2} (ref >59)
GFR calc non Af Amer: 72 mL/min/{1.73_m2} (ref >59)
Glucose: 106 mg/dL — ABNORMAL HIGH (ref 65–99)
Potassium: 3.7 mmol/L (ref 3.5–5.2)
Sodium: 140 mmol/L (ref 134–144)

## 2020-01-18 ENCOUNTER — Encounter: Payer: Self-pay | Admitting: Certified Registered Nurse Anesthetist

## 2020-01-19 ENCOUNTER — Encounter: Payer: Self-pay | Admitting: Gastroenterology

## 2020-01-19 ENCOUNTER — Ambulatory Visit (AMBULATORY_SURGERY_CENTER): Payer: 59 | Admitting: Gastroenterology

## 2020-01-19 ENCOUNTER — Other Ambulatory Visit: Payer: Self-pay

## 2020-01-19 VITALS — BP 132/72 | HR 73 | Temp 97.3°F | Resp 17 | Ht 67.0 in | Wt 224.0 lb

## 2020-01-19 DIAGNOSIS — Z1211 Encounter for screening for malignant neoplasm of colon: Secondary | ICD-10-CM

## 2020-01-19 DIAGNOSIS — D122 Benign neoplasm of ascending colon: Secondary | ICD-10-CM

## 2020-01-19 MED ORDER — SODIUM CHLORIDE 0.9 % IV SOLN: 500.0000 mL | Freq: Once | INTRAVENOUS | Status: DC

## 2020-01-19 NOTE — Progress Notes (Signed)
Called to room to assist during endoscopic procedure.  Patient ID and intended procedure confirmed with present staff. Received instructions for my participation in the procedure from the performing physician.  

## 2020-01-19 NOTE — Patient Instructions (Signed)
HANDOUTS PROVIDED ON: POLYPS & DIVERTICULOSIS  The polyps removed today have been sent for pathology.  The results can take 1-3 weeks to receive.  When your next colonoscopy should occur will be based on the pathology results.    You may resume your previous diet and medication schedule.  Thank you for allowing us to care for you today!!!   YOU HAD AN ENDOSCOPIC PROCEDURE TODAY AT THE [facility]:   Refer to the procedure report that was given to you for any specific questions about what was found during the examination.  If the procedure report does not answer your questions, please call your gastroenterologist to clarify.  If you requested that your care partner not be given the details of your procedure findings, then the procedure report has been included in a sealed envelope for you to review at your convenience later.  YOU SHOULD EXPECT: Some feelings of bloating in the abdomen. Passage of more gas than usual.  Walking can help get rid of the air that was put into your GI tract during the procedure and reduce the bloating. If you had a lower endoscopy (such as a colonoscopy or flexible sigmoidoscopy) you may notice spotting of blood in your stool or on the toilet paper. If you underwent a bowel prep for your procedure, you may not have a normal bowel movement for a few days.  Please Note:  You might notice some irritation and congestion in your nose or some drainage.  This is from the oxygen used during your procedure.  There is no need for concern and it should clear up in a day or so.  SYMPTOMS TO REPORT IMMEDIATELY:   Following lower endoscopy (colonoscopy or flexible sigmoidoscopy):  Excessive amounts of blood in the stool  Significant tenderness or worsening of abdominal pains  Swelling of the abdomen that is new, acute  Fever of 100F or higher  For urgent or emergent issues, a gastroenterologist can be reached at any hour by calling [phone]. Do not use  MyChart messaging for urgent concerns.    DIET:  We do recommend a small meal at first, but then you may proceed to your regular diet.  Drink plenty of fluids but you should avoid alcoholic beverages for 24 hours.  ACTIVITY:  You should plan to take it easy for the rest of today and you should NOT DRIVE or use heavy machinery until tomorrow (because of the sedation medicines used during the test).    FOLLOW UP: Our staff will call the number listed on your records 48-72 hours following your procedure to check on you and address any questions or concerns that you may have regarding the information given to you following your procedure. If we do not reach you, we will leave a message.  We will attempt to reach you two times.  During this call, we will ask if you have developed any symptoms of COVID 19. If you develop any symptoms (ie: fever, flu-like symptoms, shortness of breath, cough etc.) before then, please call [phone].  If you test positive for Covid 19 in the 2 weeks post procedure, please call and report this information to us.    If any biopsies were taken you will be contacted by phone or by letter within the next 1-3 weeks.  Please call us at [phone] if you have not heard about the biopsies in 3 weeks.    SIGNATURES/CONFIDENTIALITY: You and/or your care partner have signed paperwork which will be   entered into your electronic medical record.  These signatures attest to the fact that that the information above on your After Visit Summary has been reviewed and is understood.  Full responsibility of the confidentiality of this discharge information lies with you and/or your care-partner. 

## 2020-01-19 NOTE — Progress Notes (Signed)
Report given to PACU, vss 

## 2020-01-19 NOTE — Progress Notes (Signed)
[facility]  Pt's states no medical or surgical changes since previsit or office visit.  

## 2020-01-19 NOTE — Op Note (Signed)
[facility] Patient Name: Roberto Henderson Procedure Date: 01/19/2020 7:56 AM MRN: [id number] Endoscopist: Hudson. Loletha Carrow , MD Age: 49 Referring MD:  Date of Birth: 1970/06/01 Gender: Male Account #: [id number] Procedure:                Colonoscopy Indications:              Screening for colorectal malignant neoplasm, This                            is the patient's first colonoscopy Medicines:                Monitored Anesthesia Care Procedure:                Pre-Anesthesia Assessment:                           - Prior to the procedure, a History and Physical                            was performed, and patient medications and                            allergies were reviewed. The patient's tolerance of                            previous anesthesia was also reviewed. The risks                            and benefits of the procedure and the sedation                            options and risks were discussed with the patient.                            All questions were answered, and informed consent                            was obtained. Prior Anticoagulants: The patient has                            taken no previous anticoagulant or antiplatelet                            agents except for aspirin. ASA Grade Assessment: II                            - A patient with mild systemic disease. After                            reviewing the risks and benefits, the patient was                            deemed in satisfactory condition to undergo the  procedure.                           After obtaining informed consent, the colonoscope                            was passed under direct vision. Throughout the                            procedure, the patient's blood pressure, pulse, and                            oxygen saturations were monitored continuously. The                            Colonoscope was introduced through the anus and                             advanced to the the cecum, identified by                            appendiceal orifice and ileocecal valve. The                            colonoscopy was performed with difficulty due to a                            redundant colon and significant looping. Successful                            completion of the procedure was aided by using                            manual pressure. The patient tolerated the                            procedure well. The quality of the bowel                            preparation was excellent. The ileocecal valve,                            appendiceal orifice, and rectum were photographed.                            The bowel preparation used was Plenvu. Scope In: 8:07:27 AM Scope Out: 8:42:45 AM Scope Withdrawal Time: 0 hours 22 minutes 45 seconds  Total Procedure Duration: 0 hours 35 minutes 18 seconds  Findings:                 The perianal and digital rectal examinations were                            normal.  The colon (entire examined portion) was                            significantly redundant.                           A 12 mm polyp was found in the proximal ascending                            colon. The polyp was sessile. The polyp was removed                            with a hot snare. Resection and retrieval were                            complete. To prevent bleeding post-intervention,                            one hemostatic clip was successfully placed (MR                            conditional).                           A 6 mm polyp was found in the mid ascending colon.                            The polyp was semi-pedunculated. The polyp was                            removed with a hot snare. Resection and retrieval                            were complete.                           A few diverticula were found in the left colon.                           Retroflexion in the rectum was  not performed due to                            anatomy.                           The exam was otherwise without abnormality. Complications:            No immediate complications. Estimated Blood Loss:     Estimated blood loss: none. Impression:               - One 12 mm polyp in the proximal ascending colon,                            removed with a hot snare. Resected and retrieved.  Clip (MR conditional) was placed.                           - One 6 mm polyp in the mid ascending colon,                            removed with a hot snare. Resected and retrieved.                           - Diverticulosis in the left colon.                           - The examination was otherwise normal. Recommendation:           - Patient has a contact number available for                            emergencies. The signs and symptoms of potential                            delayed complications were discussed with the                            patient. Return to normal activities tomorrow.                            Written discharge instructions were provided to the                            patient.                           - Resume previous diet.                           - Continue present medications.                           - Await pathology results.                           - Repeat colonoscopy is recommended for                            surveillance. The colonoscopy date will be                            determined after pathology results from today's                            exam become available for review. Monroe Qin L. Loletha Carrow, MD 01/19/2020 8:49:15 AM This report has been signed electronically.

## 2020-01-20 ENCOUNTER — Ambulatory Visit (INDEPENDENT_AMBULATORY_CARE_PROVIDER_SITE_OTHER): Payer: 59 | Admitting: Internal Medicine

## 2020-01-20 ENCOUNTER — Encounter: Payer: Self-pay | Admitting: Internal Medicine

## 2020-01-20 VITALS — BP 178/101 | HR 93 | Temp 97.5°F | Resp 17 | Wt 212.0 lb

## 2020-01-20 DIAGNOSIS — R399 Unspecified symptoms and signs involving the genitourinary system: Secondary | ICD-10-CM | POA: Diagnosis not present

## 2020-01-20 DIAGNOSIS — I10 Essential (primary) hypertension: Secondary | ICD-10-CM | POA: Diagnosis not present

## 2020-01-20 DIAGNOSIS — Z9189 Other specified personal risk factors, not elsewhere classified: Secondary | ICD-10-CM

## 2020-01-20 DIAGNOSIS — G4700 Insomnia, unspecified: Secondary | ICD-10-CM

## 2020-01-20 MED ORDER — TRAZODONE HCL 50 MG PO TABS: 25.0000 mg | ORAL_TABLET | Freq: Every evening | ORAL | 3 refills | Status: DC | PRN

## 2020-01-20 MED ORDER — LOSARTAN POTASSIUM 100 MG PO TABS: 100.0000 mg | ORAL_TABLET | Freq: Every day | ORAL | 3 refills | Status: DC

## 2020-01-20 NOTE — Progress Notes (Signed)
Subjective:    Roberto Henderson - 50 y.o. male MRN [id number]  Date of birth: Dec 20, 1970  HPI  Roberto Henderson is here for follow up of HTN.  Chronic HTN Disease Monitoring:  Home BP Monitoring - 150s/80s  Chest pain- no  Dyspnea- no  Has been having occasional headaches throughout the week. Does not have one now. Medications: Amlodipine 10 mg, HCTZ 25 mg, Losartan 50 mg  Compliance- yes Lightheadedness- no  Edema- no    Patient reports lots of issues with incomplete sensation of voiding, nocturia, hard to initiate stream etc. IPSS of 30--indicates severe voiding symptoms.    Struggling a lot to sleep at night. Is going through separation and feeling a lot of anxiety with that.  Health Maintenance:  Health Maintenance Due  Topic Date Due  . COVID-19 Vaccine (1) Never done  . INFLUENZA VACCINE  Never done    -  reports that he has never smoked. He has never used smokeless tobacco. - Review of Systems: Per HPI. - Past Medical History: Patient Active Problem List   Diagnosis Date Noted  . Candidate for statin therapy due to risk of future cardiovascular event 10/13/2019  . Essential hypertension 10/08/2019   - Medications: reviewed and updated   Objective:   Physical Exam BP (!) 178/101   Pulse 93   Temp (!) 97.5 F (36.4 C) (Temporal)   Resp 17   Wt 212 lb (96.2 kg)   SpO2 96%   BMI 33.20 kg/m  Physical Exam Constitutional:      General: He is not in acute distress.    Appearance: He is not diaphoretic.  HENT:     Head: Normocephalic and atraumatic.  Eyes:     Conjunctiva/sclera: Conjunctivae normal.  Cardiovascular:     Rate and Rhythm: Normal rate and regular rhythm.     Heart sounds: Normal heart sounds. No murmur heard.   Pulmonary:     Effort: Pulmonary effort is normal. No respiratory distress.     Breath sounds: Normal breath sounds.  Musculoskeletal:        General: Normal range of motion.  Skin:    General: Skin is warm and  dry.  Neurological:     Mental Status: He is alert and oriented to person, place, and time.  Psychiatric:        Mood and Affect: Affect normal.        Judgment: Judgment normal.            Assessment & Plan:   1. Essential hypertension BP still above goal. Concerned for resistant HTN potentially related to undiagnosed/untreated OSA. Will order sleep study. Continue HCTZ and Amlodipine. Increase Losartan to 100 mg daily.  Counseled on blood pressure goal of less than 130/80, low-sodium, DASH diet, medication compliance, 150 minutes of moderate intensity exercise per week. Discussed medication compliance, adverse effects. - losartan (COZAAR) 100 MG tablet; Take 1 tablet (100 mg total) by mouth daily.  Dispense: 90 tablet; Refill: 3 - Basic Metabolic Panel; Future  2. At risk for sleep apnea - Split night study; Future  3. Insomnia, unspecified type Discussed there is potential risk of using sleep medication with undiagnosed OSA; patient willing to accept this risk. Trial Trazodone--discussed appropriate use. Discussed sleep hygiene.  - traZODone (DESYREL) 50 MG tablet; Take 0.5-1 tablets (25-50 mg total) by mouth at bedtime as needed for sleep.  Dispense: 30 tablet; Refill: 3  4. Lower urinary tract symptoms (LUTS) International prostate symptom screen score  is very elevated. Concerned for BPH. Obtain PSA---discussed if elevated, will refer to Urology. Will obtain UA and culture to rule out infectious etiology. If labs normal, would start Flomax. This may provide some additional BP benefit.  - Urine Culture - PSA - Urinalysis, Routine w reflex microscopic      Phill Myron, D.O. 01/20/2020, 9:46 AM [facility] at [facility]

## 2020-01-21 ENCOUNTER — Telehealth: Payer: Self-pay

## 2020-01-21 ENCOUNTER — Telehealth: Payer: Self-pay | Admitting: *Deleted

## 2020-01-21 LAB — URINALYSIS, ROUTINE W REFLEX MICROSCOPIC
Bilirubin, UA: NEGATIVE
Glucose, UA: NEGATIVE
Ketones, UA: NEGATIVE
Leukocytes,UA: NEGATIVE
Nitrite, UA: NEGATIVE
Protein,UA: NEGATIVE
RBC, UA: NEGATIVE
Specific Gravity, UA: 1.011 (ref 1.005–1.030)
Urobilinogen, Ur: 0.2 mg/dL (ref 0.2–1.0)
pH, UA: 7.5 (ref 5.0–7.5)

## 2020-01-21 LAB — PSA: Prostate Specific Ag, Serum: 2.2 ng/mL (ref 0.0–4.0)

## 2020-01-21 NOTE — Telephone Encounter (Signed)
°  Follow up Call-  Call back number 01/19/2020  Post procedure Call Back phone  # [phone]  Permission to leave phone message Yes  Some recent data might be hidden     Patient questions:  Phone picked up, but no answer. No message left.

## 2020-01-21 NOTE — Telephone Encounter (Signed)
°  Follow up Call-  Call back number 01/19/2020  Post procedure Call Back phone  # [phone]  Permission to leave phone message Yes  Some recent data might be hidden     Patient questions:  Do you have a fever, pain , or abdominal swelling? No. Pain Score  0 *  Have you tolerated food without any problems? Yes.    Have you been able to return to your normal activities? Yes.    Do you have any questions about your discharge instructions: Diet   No. Medications  No. Follow up visit  No.  Do you have questions or concerns about your Care? No.  Actions: * If pain score is 4 or above: No action needed, pain <4. 1. Have you developed a fever since your procedure? no  2.   Have you had an respiratory symptoms (SOB or cough) since your procedure? no  3.   Have you tested positive for COVID 19 since your procedure no  4.   Have you had any family members/close contacts diagnosed with the COVID 19 since your procedure?  no   If yes to any of these questions please route to Joylene John, RN and Joella Prince, RN

## 2020-01-22 LAB — URINE CULTURE

## 2020-01-23 ENCOUNTER — Other Ambulatory Visit: Payer: Self-pay | Admitting: Family Medicine

## 2020-01-26 ENCOUNTER — Other Ambulatory Visit: Payer: Self-pay | Admitting: Internal Medicine

## 2020-01-26 DIAGNOSIS — R3914 Feeling of incomplete bladder emptying: Secondary | ICD-10-CM | POA: Insufficient documentation

## 2020-01-26 DIAGNOSIS — N401 Enlarged prostate with lower urinary tract symptoms: Secondary | ICD-10-CM | POA: Insufficient documentation

## 2020-01-26 MED ORDER — TAMSULOSIN HCL 0.4 MG PO CAPS: 0.4000 mg | ORAL_CAPSULE | Freq: Every day | ORAL | 3 refills | Status: DC

## 2020-01-27 ENCOUNTER — Encounter: Payer: Self-pay | Admitting: Gastroenterology

## 2020-02-04 ENCOUNTER — Other Ambulatory Visit: Payer: Self-pay

## 2020-02-04 ENCOUNTER — Ambulatory Visit (INDEPENDENT_AMBULATORY_CARE_PROVIDER_SITE_OTHER): Payer: 59

## 2020-02-04 DIAGNOSIS — I10 Essential (primary) hypertension: Secondary | ICD-10-CM

## 2020-02-04 NOTE — Progress Notes (Signed)
Patient here for BP check & repeat BMP. Patient has taken all of his BP medications this morning. After sitting BP was 130/91, pulse 97. Patient denies chest pain, SHOB, headaches, lower extremity swelling, palpitations, dizziness. Advised to continue medications & follow up in 4 months. KWalker, CMA.

## 2020-02-05 LAB — BASIC METABOLIC PANEL
BUN/Creatinine Ratio: 8 — ABNORMAL LOW (ref 9–20)
BUN: 11 mg/dL (ref 6–24)
CO2: 28 mmol/L (ref 20–29)
Calcium: 9.6 mg/dL (ref 8.7–10.2)
Chloride: 99 mmol/L (ref 96–106)
Creatinine, Ser: 1.41 mg/dL — ABNORMAL HIGH (ref 0.76–1.27)
GFR calc Af Amer: 67 mL/min/{1.73_m2} (ref >59)
GFR calc non Af Amer: 58 mL/min/{1.73_m2} — ABNORMAL LOW (ref >59)
Glucose: 98 mg/dL (ref 65–99)
Potassium: 3.3 mmol/L — ABNORMAL LOW (ref 3.5–5.2)
Sodium: 140 mmol/L (ref 134–144)

## 2020-02-10 ENCOUNTER — Telehealth: Payer: Self-pay | Admitting: Internal Medicine

## 2020-02-16 ENCOUNTER — Telehealth: Payer: Self-pay | Admitting: Internal Medicine

## 2020-02-16 NOTE — Telephone Encounter (Signed)
Pt came in about paperwork and stated that he the [facility] paperwork could be called in about question #8 by the end of the day. He stated that they pushed it out until the end of the day.

## 2020-02-29 ENCOUNTER — Telehealth: Payer: Self-pay

## 2020-02-29 DIAGNOSIS — Z9189 Other specified personal risk factors, not elsewhere classified: Secondary | ICD-10-CM

## 2020-02-29 NOTE — Telephone Encounter (Signed)
Please place new order for home sleep study

## 2020-02-29 NOTE — Telephone Encounter (Signed)
New order dropped

## 2020-03-02 ENCOUNTER — Ambulatory Visit: Payer: 59 | Admitting: Physician Assistant

## 2020-03-09 ENCOUNTER — Telehealth (INDEPENDENT_AMBULATORY_CARE_PROVIDER_SITE_OTHER): Payer: 59 | Admitting: Physician Assistant

## 2020-03-09 DIAGNOSIS — R4189 Other symptoms and signs involving cognitive functions and awareness: Secondary | ICD-10-CM

## 2020-03-09 DIAGNOSIS — G4700 Insomnia, unspecified: Secondary | ICD-10-CM

## 2020-03-09 DIAGNOSIS — N401 Enlarged prostate with lower urinary tract symptoms: Secondary | ICD-10-CM | POA: Diagnosis not present

## 2020-03-09 DIAGNOSIS — N529 Male erectile dysfunction, unspecified: Secondary | ICD-10-CM | POA: Diagnosis not present

## 2020-03-09 DIAGNOSIS — I10 Essential (primary) hypertension: Secondary | ICD-10-CM

## 2020-03-09 DIAGNOSIS — R3914 Feeling of incomplete bladder emptying: Secondary | ICD-10-CM

## 2020-03-09 MED ORDER — SILDENAFIL CITRATE 100 MG PO TABS: 50.0000 mg | ORAL_TABLET | Freq: Every day | ORAL | 1 refills | Status: DC | PRN

## 2020-03-09 NOTE — Progress Notes (Signed)
Virtual Visit via Telephone Note  I connected with Roberto Henderson on 03/09/20 at  9:10 AM EDT by telephone and verified that I am speaking with the correct person using two identifiers.  Location: Patient: Roberto Henderson Provider: Freeman Caldron, PA-C   I discussed the limitations, risks, security and privacy concerns of performing an evaluation and management service by telephone and the availability of in person appointments. I also discussed with the patient that there may be a patient responsible charge related to this service. The patient expressed understanding and agreed to proceed.  PATIENT visit by telephone virtually in the context of Covid-19 pandemic. Patient location:  home My Location:  [place] office Persons on the call:  Me and the patient;  Roomed by Roberto Henderson   History of Present Illness:  F/up on multiple issues.  flomax has not helped with urinary issues.    Also having erectile dysfunction since starting losartan and HCTZ.  He says this has been only an issue since starting the meds.  No h/o heart attack.  No FH MI at an early age.  He would like to try viagra.  BP is running 135-140/85-90 at home.  He checks this regularly.    Sleep study has not been scheduled.  Roberto Henderson has called to check the status on this.  He says the trazadone 25 to 75mg  helps him get sleepy but then he only sleeps a few hours and wakes up groggy and tired.  Melatonin has not helped previously.  He is requesting Lorrin Mais bc it works for his brother.  Also, needs to get a CPE in the next 2 weeks for insurance purposes   Observations/Objective:  NAD.  A&O X3.  TP linear.  J/I WNL   Assessment and Plan: 1. Benign prostatic hyperplasia with incomplete bladder emptying He can stop flomax or since it has not helped or continue if desired.   - Ambulatory referral to Urology  2. Insomnia, unspecified type Advised to try 50mg  trazadone plus 5 mg melatonin and see if that improves sleep.  Not a  candidate for ambien at this time bc sleep study has not been obtained.  We are checking status on that today  3. Essential hypertension Continue losartan and HCTZ for now.  ?HCTZ could be contributing to ED?  4. Erectile dysfunction, unspecified erectile dysfunction type Ok to try and will have him address with urology.  Discussed R/B at length.  No nitrates if CP.  Patient verbalizes understanding.   - sildenafil (VIAGRA) 100 MG tablet; Take 0.5-1 tablets (50-100 mg total) by mouth daily as needed for erectile dysfunction.  Dispense: 6 tablet; Refill: 1   Follow Up Instructions: Will try for CPE w/in 2 weeks for insurance purposes   I discussed the assessment and treatment plan with the patient. The patient was provided an opportunity to ask questions and all were answered. The patient agreed with the plan and demonstrated an understanding of the instructions.   The patient was advised to call back or seek an in-person evaluation if the symptoms worsen or if the condition fails to improve as anticipated.  I provided 24 minutes of non-face-to-face time during this encounter.   Freeman Caldron, PA-C  Patient ID: Roberto Henderson, male   DOB: [birth year], 49 y.o.   MRN: [id number]

## 2020-03-15 ENCOUNTER — Other Ambulatory Visit: Payer: Self-pay

## 2020-03-15 ENCOUNTER — Ambulatory Visit (INDEPENDENT_AMBULATORY_CARE_PROVIDER_SITE_OTHER): Payer: 59 | Admitting: Dermatology

## 2020-03-15 ENCOUNTER — Encounter: Payer: Self-pay | Admitting: Dermatology

## 2020-03-15 DIAGNOSIS — Z1283 Encounter for screening for malignant neoplasm of skin: Secondary | ICD-10-CM

## 2020-03-15 DIAGNOSIS — D485 Neoplasm of uncertain behavior of skin: Secondary | ICD-10-CM

## 2020-03-15 NOTE — Patient Instructions (Signed)

## 2020-03-22 ENCOUNTER — Encounter: Payer: Self-pay | Admitting: Urology

## 2020-03-22 ENCOUNTER — Encounter: Payer: Self-pay | Admitting: Dermatology

## 2020-03-22 ENCOUNTER — Ambulatory Visit (INDEPENDENT_AMBULATORY_CARE_PROVIDER_SITE_OTHER): Payer: 59 | Admitting: Urology

## 2020-03-22 ENCOUNTER — Other Ambulatory Visit: Payer: Self-pay

## 2020-03-22 VITALS — BP 177/89 | HR 102 | Ht 69.0 in | Wt 219.9 lb

## 2020-03-22 DIAGNOSIS — R3129 Other microscopic hematuria: Secondary | ICD-10-CM | POA: Diagnosis not present

## 2020-03-22 DIAGNOSIS — R3121 Asymptomatic microscopic hematuria: Secondary | ICD-10-CM | POA: Diagnosis not present

## 2020-03-22 DIAGNOSIS — N401 Enlarged prostate with lower urinary tract symptoms: Secondary | ICD-10-CM | POA: Diagnosis not present

## 2020-03-22 DIAGNOSIS — R3914 Feeling of incomplete bladder emptying: Secondary | ICD-10-CM

## 2020-03-22 LAB — BLADDER SCAN AMB NON-IMAGING

## 2020-03-22 MED ORDER — SILDENAFIL CITRATE 20 MG PO TABS
20.0000 mg | ORAL_TABLET | Freq: Every day | ORAL | 11 refills | Status: DC | PRN
Start: 2020-03-22 — End: 2020-04-18

## 2020-03-22 MED ORDER — SILDENAFIL CITRATE 20 MG PO TABS
20.0000 mg | ORAL_TABLET | Freq: Every day | ORAL | 11 refills | Status: DC | PRN
Start: 2020-03-22 — End: 2020-03-22

## 2020-03-22 NOTE — Progress Notes (Signed)
° °  03/22/20 8:24 AM   [name] 25-Sep-1970 [id number]  CC: Urinary symptoms, ED  HPI: I saw Mr. Westling today for bothersome urinary symptoms.  He is a 49 year old male who reports 6 to 12 months of worsening urinary symptoms with primarily weak stream, urinary dribbling, and feeling of incomplete emptying.  He drinks primarily water during the day and denies any soda or caffeine intake.  He denies any gross hematuria, UTIs, or history of retention.  PSA was recently checked with PCP and was normal at 2.2.  He was tried on Flomax without any significant improvement in his urinary symptoms.  He also reports some intermittent left-sided low back pain.  Baseline creatinine is 1.4, EGFR ~65.  He has also had some trouble with erections since starting antihypertensive, and has had good results on generic sildenafil.  He denies any prior urologic surgeries.  Urinalysis today is notable for 0-5 WBCs, 3-10 RBCs, no bacteria, nitrite negative, no leukocytes.  PVR is normal at 30 mL.  IPSS score today is 29, with quality of life unhappy.   PMH: Past Medical History:  Diagnosis Date   Asthma    as a teenager   Hypertension     Surgical History: Past Surgical History:  Procedure Laterality Date   NO PAST SURGERIES      Family History: Family History  Problem Relation Age of Onset   Colon polyps Mother    Colon cancer Neg Hx    Esophageal cancer Neg Hx    Rectal cancer Neg Hx    Stomach cancer Neg Hx     Social History:  reports that he has never smoked. He has never used smokeless tobacco. He reports current alcohol use. He reports previous drug use.  Physical Exam: BP (!) 177/89 (BP Location: Left Arm, Patient Position: Sitting, Cuff Size: Large)    Pulse (!) 102    Ht 5' 9" (1.753 m)    Wt 219 lb 14.4 oz (99.7 kg)    BMI 32.47 kg/m    Constitutional:  Alert and oriented, No acute distress. Cardiovascular: No clubbing, cyanosis, or edema. Respiratory: Normal  respiratory effort, no increased work of breathing. GI: Abdomen is soft, nontender, nondistended, no abdominal masses GU: widely patent meatus, no testicular masses DRE: 40 g, smooth, no masses or nodules  Laboratory Data: Reviewed, see HPI  Pertinent Imaging: None to review  Assessment & Plan:   In summary, he is a healthy 49 year old male with severe urinary symptoms of unclear etiology, as well as erectile dysfunction.  His primary urinary complaints are weak stream and urinary dribbling, he has low grade microscopic hematuria today, but is emptying his bladder well.  We discussed common possible etiologies of microscopic hematuria including  BPH, idiopathic, stricture disease, urolithiasis, medical renal disease, and malignancy. We discussed the new asymptomatic microscopic hematuria guidelines and risk categories of low, intermediate, and high risk that are based on age, risk factors like smoking, and degree of microscopic hematuria. We discussed work-up can range from repeat urinalysis, renal ultrasound and cystoscopy, to CT urogram and cystoscopy.  They fall into the intermediate risk category, and I recommended proceeding with cystoscopy and renal ultrasound.   Cystoscopy and renal ultrasound for evaluation of intermediate risk microscopic as well as severe urinary symptoms  Nickolas Madrid, MD 03/22/2020  [facility] [address], [place], [place] [address] [phone]

## 2020-03-22 NOTE — Progress Notes (Addendum)
   Follow-Up Visit   Subjective  Roberto Henderson is a 49 y.o. male who presents for the following: New Patient (Initial Visit) (Patient here today for spot on right arm x years no bleeding, no pain. Patient just wants the spot removed.).  Growth Location: Right arm Duration:  Quality: Larger Associated Signs/Symptoms: Modifying Factors:  Severity:  Timing: Context: Patient requests removal  Objective  Well appearing patient in no apparent distress; mood and affect are within normal limits.  All skin waist up examined.   Assessment & Plan    Neoplasm of uncertain behavior of skin Right Upper Arm - Anterior  Skin / nail biopsy Type of biopsy: tangential   Informed consent: discussed and consent obtained   Timeout: patient name, date of birth, surgical site, and procedure verified   Procedure prep:  Patient was prepped and draped in usual sterile fashion (Non sterile) Prep type:  Chlorhexidine Anesthesia: the lesion was anesthetized in a standard fashion   Anesthetic:  1% lidocaine w/ epinephrine 1-100,000 local infiltration Instrument used: flexible razor blade   Outcome: patient tolerated procedure well   Post-procedure details: wound care instructions given    Specimen 1 - Surgical pathology Differential Diagnosis: SK Check Margins: No  Skin exam for malignant neoplasm Neck - Posterior  Encourage Mr. Ihde to self examine his skin twice annually.     I, Lavonna Monarch, MD, have reviewed all documentation for this visit.  The documentation on 04/16/20 for the exam, diagnosis, procedures, and orders are all accurate and complete.

## 2020-03-22 NOTE — Patient Instructions (Signed)
Cystoscopy Cystoscopy is a procedure that is used to help diagnose and sometimes treat conditions that affect the lower urinary tract. The lower urinary tract includes the bladder and the urethra. The urethra is the tube that drains urine from the bladder. Cystoscopy is done using a thin, tube-shaped instrument with a light and camera at the end (cystoscope). The cystoscope may be hard or flexible, depending on the goal of the procedure. The cystoscope is inserted through the urethra, into the bladder. Cystoscopy may be recommended if you have:  Urinary tract infections that keep coming back.  Blood in the urine (hematuria).  An inability to control when you urinate (urinary incontinence) or an overactive bladder.  Unusual cells found in a urine sample.  A blockage in the urethra, such as a urinary stone.  Painful urination.  An abnormality in the bladder found during an intravenous pyelogram (IVP) or CT scan. Cystoscopy may also be done to remove a sample of tissue to be examined under a microscope (biopsy). What are the risks? Generally, this is a safe procedure. However, problems may occur, including:  Infection.  Bleeding.  What happens during the procedure?  1. You will be given one or more of the following: ? A medicine to numb the area (local anesthetic). 2. The area around the opening of your urethra will be cleaned. 3. The cystoscope will be passed through your urethra into your bladder. 4. Germ-free (sterile) fluid will flow through the cystoscope to fill your bladder. The fluid will stretch your bladder so that your health care provider can clearly examine your bladder walls. 5. Your doctor will look at the urethra and bladder. 6. The cystoscope will be removed The procedure may vary among health care providers  What can I expect after the procedure? After the procedure, it is common to have: 1. Some soreness or pain in your abdomen and urethra. 2. Urinary symptoms.  These include: ? Mild pain or burning when you urinate. Pain should stop within a few minutes after you urinate. This may last for up to 1 week. ? A small amount of blood in your urine for several days. ? Feeling like you need to urinate but producing only a small amount of urine. Follow these instructions at home: General instructions  Return to your normal activities as told by your health care provider.   Do not drive for 24 hours if you were given a sedative during your procedure.  Watch for any blood in your urine. If the amount of blood in your urine increases, call your health care provider.  If a tissue sample was removed for testing (biopsy) during your procedure, it is up to you to get your test results. Ask your health care provider, or the department that is doing the test, when your results will be ready.  Drink enough fluid to keep your urine pale yellow.  Keep all follow-up visits as told by your health care provider. This is important. Contact a health care provider if you:  Have pain that gets worse or does not get better with medicine, especially pain when you urinate.  Have trouble urinating.  Have more blood in your urine. Get help right away if you:  Have blood clots in your urine.  Have abdominal pain.  Have a fever or chills.  Are unable to urinate. Summary  Cystoscopy is a procedure that is used to help diagnose and sometimes treat conditions that affect the lower urinary tract.  Cystoscopy is done using   a thin, tube-shaped instrument with a light and camera at the end.  After the procedure, it is common to have some soreness or pain in your abdomen and urethra.  Watch for any blood in your urine. If the amount of blood in your urine increases, call your health care provider.  If you were prescribed an antibiotic medicine, take it as told by your health care provider. Do not stop taking the antibiotic even if you start to feel better. This  information is not intended to replace advice given to you by your health care provider. Make sure you discuss any questions you have with your health care provider. Document Revised: 04/21/2018 Document Reviewed: 04/21/2018 Elsevier Patient Education  [place].   Benign Prostatic Hyperplasia  Benign prostatic hyperplasia (BPH) is an enlarged prostate gland that is caused by the normal aging process and not by cancer. The prostate is a walnut-sized gland that is involved in the production of semen. It is located in front of the rectum and below the bladder. The bladder stores urine and the urethra is the tube that carries the urine out of the body. The prostate may get bigger as a man gets older. An enlarged prostate can press on the urethra. This can make it harder to pass urine. The build-up of urine in the bladder can cause infection. Back pressure and infection may progress to bladder damage and kidney (renal) failure. What are the causes? This condition is part of a normal aging process. However, not all men develop problems from this condition. If the prostate enlarges away from the urethra, urine flow will not be blocked. If it enlarges toward the urethra and compresses it, there will be problems passing urine. What increases the risk? This condition is more likely to develop in men over the age of 98 years. What are the signs or symptoms? Symptoms of this condition include:  Getting up often during the night to urinate.  Needing to urinate frequently during the day.  Difficulty starting urine flow.  Decrease in size and strength of your urine stream.  Leaking (dribbling) after urinating.  Inability to pass urine. This needs immediate treatment.  Inability to completely empty your bladder.  Pain when you pass urine. This is more common if there is also an infection.  Urinary tract infection (UTI). How is this diagnosed? This condition is diagnosed based on your  medical history, a physical exam, and your symptoms. Tests will also be done, such as:  A post-void bladder scan. This measures any amount of urine that may remain in your bladder after you finish urinating.  A digital rectal exam. In a rectal exam, your health care provider checks your prostate by putting a lubricated, gloved finger into your rectum to feel the back of your prostate gland. This exam detects the size of your gland and any abnormal lumps or growths.  An exam of your urine (urinalysis).  A prostate specific antigen (PSA) screening. This is a blood test used to screen for prostate cancer.  An ultrasound. This test uses sound waves to electronically produce a picture of your prostate gland. Your health care provider may refer you to a specialist in kidney and prostate diseases (urologist). How is this treated? Once symptoms begin, your health care provider will monitor your condition (active surveillance or watchful waiting). Treatment for this condition will depend on the severity of your condition. Treatment may include:  Observation and yearly exams. This may be the only treatment needed  if your condition and symptoms are mild.  Medicines to relieve your symptoms, including: ? Medicines to shrink the prostate. ? Medicines to relax the muscle of the prostate.  Surgery in severe cases. Surgery may include: ? Prostatectomy. In this procedure, the prostate tissue is removed completely through an open incision or with a laparoscope or robotics. ? Transurethral resection of the prostate (TURP). In this procedure, a tool is inserted through the opening at the tip of the penis (urethra). It is used to cut away tissue of the inner core of the prostate. The pieces are removed through the same opening of the penis. This removes the blockage. ? Transurethral incision (TUIP). In this procedure, small cuts are made in the prostate. This lessens the prostate's pressure on the  urethra. ? Transurethral microwave thermotherapy (TUMT). This procedure uses microwaves to create heat. The heat destroys and removes a small amount of prostate tissue. ? Transurethral needle ablation (TUNA). This procedure uses radio frequencies to destroy and remove a small amount of prostate tissue. ? Interstitial laser coagulation ([place]). This procedure uses a laser to destroy and remove a small amount of prostate tissue. ? Transurethral electrovaporization (TUVP). This procedure uses electrodes to destroy and remove a small amount of prostate tissue. ? Prostatic urethral lift. This procedure inserts an implant to push the lobes of the prostate away from the urethra. Follow these instructions at home:  Take over-the-counter and prescription medicines only as told by your health care provider.  Monitor your symptoms for any changes. Contact your health care provider with any changes.  Avoid drinking large amounts of liquid before going to bed or out in public.  Avoid or reduce how much caffeine or alcohol you drink.  Give yourself time when you urinate.  Keep all follow-up visits as told by your health care provider. This is important. Contact a health care provider if:  You have unexplained back pain.  Your symptoms do not get better with treatment.  You develop side effects from the medicine you are taking.  Your urine becomes very dark or has a bad smell.  Your lower abdomen becomes distended and you have trouble passing your urine. Get help right away if:  You have a fever or chills.  You suddenly cannot urinate.  You feel lightheaded, or very dizzy, or you faint.  There are large amounts of blood or clots in the urine.  Your urinary problems become hard to manage.  You develop moderate to severe low back or flank pain. The flank is the side of your body between the ribs and the hip. These symptoms may represent a serious problem that is an emergency. Do not wait to  see if the symptoms will go away. Get medical help right away. Call your local emergency services (911 in the [place] Do not drive yourself to the hospital. Summary  Benign prostatic hyperplasia (BPH) is an enlarged prostate that is caused by the normal aging process and not by cancer.  An enlarged prostate can press on the urethra. This can make it hard to pass urine.  This condition is part of a normal aging process and is more likely to develop in men over the age of 56 years.  Get help right away if you suddenly cannot urinate. This information is not intended to replace advice given to you by your health care provider. Make sure you discuss any questions you have with your health care provider. Document Revised: 03/24/2018 Document Reviewed: 06/03/2016 Elsevier Patient Education  2020 Elsevier Inc.  

## 2020-03-23 LAB — URINALYSIS, COMPLETE
Bilirubin, UA: NEGATIVE
Glucose, UA: NEGATIVE
Ketones, UA: NEGATIVE
Leukocytes,UA: NEGATIVE
Nitrite, UA: NEGATIVE
Protein,UA: NEGATIVE
Specific Gravity, UA: 1.025 (ref 1.005–1.030)
Urobilinogen, Ur: 0.2 mg/dL (ref 0.2–1.0)
pH, UA: 7 (ref 5.0–7.5)

## 2020-03-23 LAB — MICROSCOPIC EXAMINATION: Bacteria, UA: NONE SEEN

## 2020-03-27 NOTE — Patient Instructions (Addendum)
Discontinue Hydrochlorothiazide and hold potassium tablets until your labs result. Start metoprolol and continue all other medications. Take metoprolol at bedtime to decrease dizziness related symptoms.  The nurse will follow-up with you in 3 weeks regarding blood pressure readings. You can communicate your blood pressure readings via MyChart message.      Lab results will be available within  2-3 day via My Chart.   Keeping you healthy  Get these tests  Blood pressure- Have your blood pressure checked once a year by your healthcare provider.  Normal blood pressure is 120/80  Weight- Have your body mass index (BMI) calculated to screen for obesity.  BMI is a measure of body fat based on height and weight. You can also calculate your own BMI at [employer].  Cholesterol- Have your cholesterol checked every year.  Diabetes- Have your blood sugar checked regularly if you have high blood pressure, high cholesterol, have a family history of diabetes or if you are overweight.  Screening for Colon Cancer- Colonoscopy starting at age 5.  Screening may begin sooner depending on your family history and other health conditions. Follow up colonoscopy as directed by your Gastroenterologist.  Screening for Prostate Cancer- Both blood work (PSA) and a rectal exam help screen for Prostate Cancer.  Screening begins at age 57 with African-American men and at age 38 with Caucasian men.  Screening may begin sooner depending on your family history.  Take these medicines  Aspirin- One aspirin daily can help prevent Heart disease and Stroke.  Flu shot- Every fall.  Tetanus- Every 10 years.  Zostavax- Once after the age of 72 to prevent Shingles.  Pneumonia shot- Once after the age of 18; if you are younger than 49, ask your healthcare provider if you need a Pneumonia shot.  Take these steps  Don't smoke- If you do smoke, talk to your doctor about quitting.  For tips on how to quit, go  to www.smokefree.gov or call 1-800-QUIT-NOW.  Be physically active- Exercise 5 days a week for at least 30 minutes.  If you are not already physically active start slow and gradually work up to 30 minutes of moderate physical activity.  Examples of moderate activity include walking briskly, mowing the yard, dancing, swimming, bicycling, etc.  Eat a healthy diet- Eat a variety of healthy food such as fruits, vegetables, low fat milk, low fat cheese, yogurt, lean meant, poultry, fish, beans, tofu, etc. For more information go to www.thenutritionsource.org  Drink alcohol in moderation- Limit alcohol intake to less than two drinks a day. Never drink and drive.  Dentist- Brush and floss twice daily; visit your dentist twice a year.  Depression- Your emotional health is as important as your physical health. If you're feeling down, or losing interest in things you would normally enjoy please talk to your healthcare provider.  Eye exam- Visit your eye doctor every year.  Safe sex- If you may be exposed to a sexually transmitted infection, use a condom.  Seat belts- Seat belts can save your life; always wear one.  Smoke/Carbon Monoxide detectors- These detectors need to be installed on the appropriate level of your home.  Replace batteries at least once a year.  Skin cancer- When out in the sun, cover up and use sunscreen 15 SPF or higher.  Violence- If anyone is threatening you, please tell your healthcare provider.  Living Will/ Health care power of attorney- Speak with your healthcare provider and family.

## 2020-03-28 ENCOUNTER — Ambulatory Visit (INDEPENDENT_AMBULATORY_CARE_PROVIDER_SITE_OTHER): Payer: 59 | Admitting: Family Medicine

## 2020-03-28 ENCOUNTER — Other Ambulatory Visit: Payer: Self-pay

## 2020-03-28 VITALS — BP 146/87 | HR 95 | Temp 97.5°F | Resp 17 | Ht 69.0 in | Wt 216.0 lb

## 2020-03-28 DIAGNOSIS — I10 Essential (primary) hypertension: Secondary | ICD-10-CM

## 2020-03-28 DIAGNOSIS — Z23 Encounter for immunization: Secondary | ICD-10-CM

## 2020-03-28 DIAGNOSIS — Z0000 Encounter for general adult medical examination without abnormal findings: Secondary | ICD-10-CM

## 2020-03-28 DIAGNOSIS — Z1329 Encounter for screening for other suspected endocrine disorder: Secondary | ICD-10-CM | POA: Diagnosis not present

## 2020-03-28 DIAGNOSIS — Z13220 Encounter for screening for lipoid disorders: Secondary | ICD-10-CM | POA: Diagnosis not present

## 2020-03-28 DIAGNOSIS — Z131 Encounter for screening for diabetes mellitus: Secondary | ICD-10-CM | POA: Diagnosis not present

## 2020-03-28 DIAGNOSIS — R7989 Other specified abnormal findings of blood chemistry: Secondary | ICD-10-CM

## 2020-03-28 DIAGNOSIS — Z113 Encounter for screening for infections with a predominantly sexual mode of transmission: Secondary | ICD-10-CM

## 2020-03-28 MED ORDER — METOPROLOL SUCCINATE ER 25 MG PO TB24: 25.0000 mg | ORAL_TABLET | Freq: Every day | ORAL | 3 refills | Status: DC

## 2020-03-28 NOTE — Progress Notes (Signed)
Patient ID: Roberto Henderson, male    DOB: 16-Nov-1970, 49 y.o.   MRN: [id number]  PCP: Nicolette Bang, DO  Chief Complaint  Patient presents with  . Annual Exam    Subjective:  HPI Roberto Henderson is a 49 y.o. male presents or complete physical exam. Currently follow-up by urology and is actively being worked up for BPH causing urinary obstructive issues. He was started on Flomax, discontinued due to no improvement of urinary retention problems. BP today elevated beyond goal of <130/90. He endorses home reading Average in 867'Y -195'K systolic and occasionally upper 130's.  He is making lifestyle changes in to improved BP. Denies CP, SOB, weight gain or dizziness. Compliant with all medications.  Body mass index is 31.9 kg/m. Health Screening Current/Overdue:   Immunizations: Flu today, COVID 2/3, plans to obtain COVID booster PSA: 2.2, recently value  Last Dental Exam: current  Last Eye Exam: current Chronic conditions include: Patient Active Problem List   Diagnosis Date Noted  . Benign prostatic hyperplasia with incomplete bladder emptying 01/26/2020  . Candidate for statin therapy due to risk of future cardiovascular event 10/13/2019  . Essential hypertension 10/08/2019      Current BMI: Body mass index is 31.9 kg/m.   Dietary habits:  Current home medications include: Prior to Admission medications   Medication Sig Start Date End Date Taking? Authorizing Provider  amLODipine (NORVASC) 10 MG tablet Take 1 tablet (10 mg total) by mouth daily. 10/08/19  Yes Nicolette Bang, DO  aspirin (EC-81 ASPIRIN) 81 MG EC tablet Take 1 tablet (81 mg total) by mouth daily. Swallow whole. 10/13/19  Yes Nicolette Bang, DO  atorvastatin (LIPITOR) 40 MG tablet Take 1 tablet (40 mg total) by mouth daily. 11/12/19  Yes Nicolette Bang, DO  hydrochlorothiazide (HYDRODIURIL) 25 MG tablet Take 1 tablet (25 mg total) by mouth daily. 11/12/19  Yes Nicolette Bang, DO  Lactobacillus (PROBIOTIC ACIDOPHILUS PO) Take 1 capsule by mouth daily.   Yes [provider]  losartan (COZAAR) 100 MG tablet Take 1 tablet (100 mg total) by mouth daily. 01/20/20  Yes Nicolette Bang, DO  Multiple Vitamin (MULTIVITAMIN) tablet Take 1 tablet by mouth daily.   Yes [provider]  potassium chloride (KLOR-CON) 10 MEQ tablet TAKE 1 TABLET(10 MEQ) BY MOUTH DAILY 01/24/20  Yes Fulp, Cammie, MD  sildenafil (REVATIO) 20 MG tablet Take 1-5 tablets (20-100 mg total) by mouth daily as needed. 03/22/20  Yes Billey Co, MD  sildenafil (VIAGRA) 100 MG tablet Take 0.5-1 tablets (50-100 mg total) by mouth daily as needed for erectile dysfunction. 03/09/20  Yes Argentina Donovan, PA-C  tamsulosin (FLOMAX) 0.4 MG CAPS capsule Take 1 capsule (0.4 mg total) by mouth daily. 01/26/20  Yes Nicolette Bang, DO  traZODone (DESYREL) 50 MG tablet Take 0.5-1 tablets (25-50 mg total) by mouth at bedtime as needed for sleep. 01/20/20  Yes Nicolette Bang, DO    Family History  Problem Relation Age of Onset  . Colon polyps Mother   . Colon cancer Neg Hx   . Esophageal cancer Neg Hx   . Rectal cancer Neg Hx   . Stomach cancer Neg Hx     No Known Allergies  Social History   Socioeconomic History  . Marital status: Married    Spouse name: Not on file  . Number of children: Not on file  . Years of education: Not on file  . Highest education level:  Not on file  Occupational History  . Not on file  Tobacco Use  . Smoking status: Never Smoker  . Smokeless tobacco: Never Used  Vaping Use  . Vaping Use: Never used  Substance and Sexual Activity  . Alcohol use: Yes    Comment: occ  . Drug use: Not Currently  . Sexual activity: Yes    Birth control/protection: None  Other Topics Concern  . Not on file  Social History Narrative  . Not on file   Social Determinants of Health   Financial Resource Strain:   . Difficulty of  Paying Living Expenses: Not on file  Food Insecurity:   . Worried About Charity fundraiser in the Last Year: Not on file  . Ran Out of Food in the Last Year: Not on file  Transportation Needs:   . Lack of Transportation (Medical): Not on file  . Lack of Transportation (Non-Medical): Not on file  Physical Activity:   . Days of Exercise per Week: Not on file  . Minutes of Exercise per Session: Not on file  Stress:   . Feeling of Stress : Not on file  Social Connections:   . Frequency of Communication with Friends and Family: Not on file  . Frequency of Social Gatherings with Friends and Family: Not on file  . Attends Religious Services: Not on file  . Active Member of Clubs or Organizations: Not on file  . Attends Archivist Meetings: Not on file  . Marital Status: Not on file  Intimate Partner Violence:   . Fear of Current or Ex-Partner: Not on file  . Emotionally Abused: Not on file  . Physically Abused: Not on file  . Sexually Abused: Not on file    Review of Systems Pertinent negatives listed in HPI  Past Medical, Surgical Family and Social History reviewed and updated.  Objective:   Today's Vitals   03/28/20 0842  BP: (!) 146/87  Pulse: 95  Resp: 17  Temp: (!) 97.5 F (36.4 C)  TempSrc: Temporal  SpO2: 98%  Weight: 216 lb (98 kg)  Height: 5\' 9"  (1.753 m)    Wt Readings from Last 3 Encounters:  03/28/20 216 lb (98 kg)  03/22/20 219 lb 14.4 oz (99.7 kg)  01/20/20 212 lb (96.2 kg)    Physical Exam Constitutional: Patient appears well-developed and well-nourished. HENT: Normocephalic, atraumatic, External right and left ear normal. Eyes: Conjunctivae and EOM are normal. PERRLA, no scleral icterus. Neck: Normal ROM. Neck supple. No JVD. No tracheal deviation. No thyromegaly. CVS: RRR, S1/S2 +, no murmurs, no gallops, no carotid bruit.  Pulmonary: Effort and breath sounds normal, no stridor, rhonchi, wheezes, rales.  Abdominal: Soft. BS +, distension  present (soft) , no tenderness, rebound or guarding.  Musculoskeletal: Normal range of motion. No edema and no tenderness.  Neuro: Alert. Normal reflexes, muscle tone coordination. No cranial nerve deficit. Skin: Skin is warm and dry. No rash noted. Not diaphoretic. No erythema. No pallor. Psychiatric: Normal mood and affect. Behavior, judgment, thought content normal.    Assessment & Plan:  1. Annual physical exam Age appropriate anticipatory guidance provided   2. Diabetes mellitus screening - Hemoglobin A1c  3. Thyroid disorder screen - Thyroid Panel With TSH  4. Needs flu shot - Flu Vaccine QUAD 6+ mos PF IM (Fluarix Quad PF)  5. Elevated serum creatinine - Comprehensive metabolic panel  6. Screening for STDs (sexually transmitted diseases) - GC/Chlamydia Probe Amp([facility]) - RPR  7. Lipid  screening - Lipid panel  8. Uncontrolled hypertension -Discontinue HCTZ (recent lab show elevated creatinine, BP not at goal and patient has underlying urinary retention r/t BPH)  -We have discussed target BP range and blood pressure goal. I have advised patient to check BP regularly and to call us back or report to clinic if the numbers are consistently higher than 140/90. We discussed the importance of compliance with medical therapy and DASH diet  -Continue Losartan and Amlodipine -D/C HCTZ, hold K+ replacement -Start metoprolol XL 25 mg at bedtime   BP check in 3 weeks with nurse via My Chart  recommended, consequences of uncontrolled hypertension discussed.  - continue current BP medications  Molli Barrows, FNP [facility] at [facility] [address] [place] [phone]: [phone]

## 2020-03-29 LAB — THYROID PANEL WITH TSH
Free Thyroxine Index: 1.5 (ref 1.2–4.9)
T3 Uptake Ratio: 27 % (ref 24–39)
T4, Total: 5.7 ug/dL (ref 4.5–12.0)
TSH: 1.57 u[IU]/mL (ref 0.450–4.500)

## 2020-03-29 LAB — LIPID PANEL
Chol/HDL Ratio: 1.8 ratio (ref 0.0–5.0)
Cholesterol, Total: 90 mg/dL — ABNORMAL LOW (ref 100–199)
HDL: 49 mg/dL (ref >39)
LDL Chol Calc (NIH): 28 mg/dL (ref 0–99)
Triglycerides: 50 mg/dL (ref 0–149)
VLDL Cholesterol Cal: 13 mg/dL (ref 5–40)

## 2020-03-29 LAB — COMPREHENSIVE METABOLIC PANEL
ALT: 33 IU/L (ref 0–44)
AST: 18 IU/L (ref 0–40)
Albumin/Globulin Ratio: 1.4 (ref 1.2–2.2)
Albumin: 4.7 g/dL (ref 4.0–5.0)
Alkaline Phosphatase: 121 IU/L (ref 44–121)
BUN/Creatinine Ratio: 10 (ref 9–20)
BUN: 14 mg/dL (ref 6–24)
Bilirubin Total: 0.4 mg/dL (ref 0.0–1.2)
CO2: 29 mmol/L (ref 20–29)
Calcium: 9.9 mg/dL (ref 8.7–10.2)
Chloride: 99 mmol/L (ref 96–106)
Creatinine, Ser: 1.47 mg/dL — ABNORMAL HIGH (ref 0.76–1.27)
GFR calc Af Amer: 63 mL/min/{1.73_m2} (ref >59)
GFR calc non Af Amer: 55 mL/min/{1.73_m2} — ABNORMAL LOW (ref >59)
Globulin, Total: 3.3 g/dL (ref 1.5–4.5)
Glucose: 101 mg/dL — ABNORMAL HIGH (ref 65–99)
Potassium: 4 mmol/L (ref 3.5–5.2)
Sodium: 144 mmol/L (ref 134–144)
Total Protein: 8 g/dL (ref 6.0–8.5)

## 2020-03-29 LAB — HEMOGLOBIN A1C
Est. average glucose Bld gHb Est-mCnc: 120 mg/dL
Hgb A1c MFr Bld: 5.8 % — ABNORMAL HIGH (ref 4.8–5.6)

## 2020-03-29 LAB — RPR: RPR Ser Ql: NONREACTIVE

## 2020-03-30 LAB — GC/CHLAMYDIA PROBE AMP
Chlamydia trachomatis, NAA: NEGATIVE
Neisseria Gonorrhoeae by PCR: NEGATIVE

## 2020-03-31 NOTE — Addendum Note (Signed)
Addended by: Robyne Askew R on: 03/31/2020 10:45 AM   Modules accepted: Level of Service

## 2020-04-03 NOTE — Progress Notes (Signed)
Overall your labs are stable. Creatinine which is a measure of renal function is mildly elevated. Recommend hydrating well with plain water to hydrate kidneys. Hemoglobin A1c measures blood sugar averages over the last 3 months and your reading falls into the prediabetic category. You are no currently in need of medications. I do recommend reducing starches, breads, and increase vegetables along with routine exercise to manage control of blood sugar.  All other levels are within expected ranges.Marland Kitchen

## 2020-04-04 ENCOUNTER — Ambulatory Visit (HOSPITAL_BASED_OUTPATIENT_CLINIC_OR_DEPARTMENT_OTHER): Payer: 59 | Admitting: Internal Medicine

## 2020-04-04 ENCOUNTER — Other Ambulatory Visit: Payer: Self-pay

## 2020-04-04 VITALS — Ht 69.0 in | Wt 210.0 lb

## 2020-04-11 ENCOUNTER — Other Ambulatory Visit: Payer: Self-pay | Admitting: Urology

## 2020-04-12 ENCOUNTER — Ambulatory Visit (HOSPITAL_BASED_OUTPATIENT_CLINIC_OR_DEPARTMENT_OTHER): Payer: 59 | Attending: Internal Medicine | Admitting: Internal Medicine

## 2020-04-12 ENCOUNTER — Ambulatory Visit
Admission: RE | Admit: 2020-04-12 | Discharge: 2020-04-12 | Disposition: A | Discharge status: Home / Self Care | Payer: 59 | Source: Ambulatory Visit | Attending: Urology | Admitting: Urology

## 2020-04-12 ENCOUNTER — Other Ambulatory Visit: Payer: Self-pay

## 2020-04-12 VITALS — Ht 69.0 in | Wt 210.0 lb

## 2020-04-12 DIAGNOSIS — R3129 Other microscopic hematuria: Secondary | ICD-10-CM | POA: Insufficient documentation

## 2020-04-12 DIAGNOSIS — Z9189 Other specified personal risk factors, not elsewhere classified: Secondary | ICD-10-CM

## 2020-04-12 DIAGNOSIS — G4733 Obstructive sleep apnea (adult) (pediatric): Secondary | ICD-10-CM

## 2020-04-18 ENCOUNTER — Ambulatory Visit (INDEPENDENT_AMBULATORY_CARE_PROVIDER_SITE_OTHER): Payer: 59 | Admitting: Urology

## 2020-04-18 ENCOUNTER — Ambulatory Visit: Payer: 59

## 2020-04-18 ENCOUNTER — Other Ambulatory Visit
Admission: RE | Admit: 2020-04-18 | Discharge: 2020-04-18 | Disposition: A | Discharge status: Home / Self Care | Payer: 59 | Attending: Urology | Admitting: Urology

## 2020-04-18 ENCOUNTER — Encounter: Payer: Self-pay | Admitting: Urology

## 2020-04-18 ENCOUNTER — Other Ambulatory Visit: Payer: Self-pay | Admitting: *Deleted

## 2020-04-18 ENCOUNTER — Other Ambulatory Visit: Payer: Self-pay

## 2020-04-18 VITALS — BP 188/111 | HR 86 | Ht 68.0 in | Wt 225.0 lb

## 2020-04-18 DIAGNOSIS — R3914 Feeling of incomplete bladder emptying: Secondary | ICD-10-CM

## 2020-04-18 DIAGNOSIS — N529 Male erectile dysfunction, unspecified: Secondary | ICD-10-CM | POA: Diagnosis not present

## 2020-04-18 DIAGNOSIS — N401 Enlarged prostate with lower urinary tract symptoms: Secondary | ICD-10-CM | POA: Diagnosis not present

## 2020-04-18 DIAGNOSIS — N138 Other obstructive and reflux uropathy: Secondary | ICD-10-CM

## 2020-04-18 DIAGNOSIS — R3121 Asymptomatic microscopic hematuria: Secondary | ICD-10-CM | POA: Diagnosis not present

## 2020-04-18 LAB — URINALYSIS, COMPLETE (UACMP) WITH MICROSCOPIC
Bacteria, UA: NONE SEEN
Bilirubin Urine: NEGATIVE
Glucose, UA: NEGATIVE mg/dL
Ketones, ur: NEGATIVE mg/dL
Leukocytes,Ua: NEGATIVE
Nitrite: NEGATIVE
Protein, ur: NEGATIVE mg/dL
Specific Gravity, Urine: 1.01 (ref 1.005–1.030)
Squamous Epithelial / HPF: NONE SEEN (ref 0–5)
WBC, UA: NONE SEEN WBC/hpf (ref 0–5)
pH: 7 (ref 5.0–8.0)

## 2020-04-18 MED ORDER — ALFUZOSIN HCL ER 10 MG PO TB24: 10.0000 mg | ORAL_TABLET | Freq: Every day | ORAL | 11 refills | Status: DC

## 2020-04-18 MED ORDER — TADALAFIL 20 MG PO TABS: 20.0000 mg | ORAL_TABLET | Freq: Every day | ORAL | 11 refills | Status: DC | PRN

## 2020-04-18 MED ORDER — ALFUZOSIN HCL ER 10 MG PO TB24
10.0000 mg | ORAL_TABLET | Freq: Every day | ORAL | 11 refills | Status: DC
Start: 2020-04-18 — End: 2021-07-05

## 2020-04-18 NOTE — Progress Notes (Signed)
Cystoscopy Procedure Note:  Indication: Microscopic hematuria, urinary symptoms  After informed consent and discussion of the procedure and its risks, Roberto Henderson was positioned and prepped in the standard fashion. Cystoscopy was performed with a flexible cystoscope. The urethra, bladder neck and entire bladder was visualized in a standard fashion. The prostate was moderate in size with a high bladder neck and obstructing lateral lobes. The ureteral orifices were visualized in their normal location and orientation. Mild bladder trabeculations, no suspicious bladder lesions, no abnormalities on retroflexion.  Imaging: Renal US with no hydro or masses, unable to measure prostate volume. I personally viewed and interpreted the images  Findings: Moderate sized prostate with obstructing lateral lobes and high bladder neck, bladder and urethra normal  ----------------------------------------------------------------------  Assessment and Plan: 49 year old male with microscopic hematuria and severe urinary symptoms of weak stream, urinary dribbling, and feeling of incomplete emptying (IPSS score 29, quality of life unhappy). No significant improvement previously with Flomax. Cystoscopy today shows a moderate size prostate with obstructing lateral lobes and a high bladder neck, but no strictures or bladder abnormalities. I recommended a trial of alfuzosin with close follow-up in 1 week and consider TRUS for prostate volume at that time and further discuss UroLift and HOLEP. We briefly reviewed these 2 procedures and the risks and benefits.  He is not getting satisfactory results with sildenafil for his ED at this time despite 100 mg dose. I recommended a trial of Cialis 20 mg on demand, and risks and benefits discussed.  RTC 4 weeks for symptom check for ED and BPH, IPSS/PVR. Consider TRUS at that visit and further discussion of outlet procedures  Nickolas Madrid, MD 04/18/2020

## 2020-04-18 NOTE — Patient Instructions (Signed)
Benign Prostatic Hyperplasia  Benign prostatic hyperplasia (BPH) is an enlarged prostate gland that is caused by the normal aging process and not by cancer. The prostate is a walnut-sized gland that is involved in the production of semen. It is located in front of the rectum and below the bladder. The bladder stores urine and the urethra is the tube that carries the urine out of the body. The prostate may get bigger as a man gets older. An enlarged prostate can press on the urethra. This can make it harder to pass urine. The build-up of urine in the bladder can cause infection. Back pressure and infection may progress to bladder damage and kidney (renal) failure. What are the causes? This condition is part of a normal aging process. However, not all men develop problems from this condition. If the prostate enlarges away from the urethra, urine flow will not be blocked. If it enlarges toward the urethra and compresses it, there will be problems passing urine. What increases the risk? This condition is more likely to develop in men over the age of 50 years. What are the signs or symptoms? Symptoms of this condition include:  Getting up often during the night to urinate.  Needing to urinate frequently during the day.  Difficulty starting urine flow.  Decrease in size and strength of your urine stream.  Leaking (dribbling) after urinating.  Inability to pass urine. This needs immediate treatment.  Inability to completely empty your bladder.  Pain when you pass urine. This is more common if there is also an infection.  Urinary tract infection (UTI). How is this diagnosed? This condition is diagnosed based on your medical history, a physical exam, and your symptoms. Tests will also be done, such as:  A post-void bladder scan. This measures any amount of urine that may remain in your bladder after you finish urinating.  A digital rectal exam. In a rectal exam, your health care provider  checks your prostate by putting a lubricated, gloved finger into your rectum to feel the back of your prostate gland. This exam detects the size of your gland and any abnormal lumps or growths.  An exam of your urine (urinalysis).  A prostate specific antigen (PSA) screening. This is a blood test used to screen for prostate cancer.  An ultrasound. This test uses sound waves to electronically produce a picture of your prostate gland. Your health care provider may refer you to a specialist in kidney and prostate diseases (urologist). How is this treated? Once symptoms begin, your health care provider will monitor your condition (active surveillance or watchful waiting). Treatment for this condition will depend on the severity of your condition. Treatment may include:  Observation and yearly exams. This may be the only treatment needed if your condition and symptoms are mild.  Medicines to relieve your symptoms, including: ? Medicines to shrink the prostate. ? Medicines to relax the muscle of the prostate.  Surgery in severe cases. Surgery may include: ? Prostatectomy. In this procedure, the prostate tissue is removed completely through an open incision or with a laparoscope or robotics. ? Transurethral resection of the prostate (TURP). In this procedure, a tool is inserted through the opening at the tip of the penis (urethra). It is used to cut away tissue of the inner core of the prostate. The pieces are removed through the same opening of the penis. This removes the blockage. ? Transurethral incision (TUIP). In this procedure, small cuts are made in the prostate. This lessens   the prostate's pressure on the urethra. ? Transurethral microwave thermotherapy (TUMT). This procedure uses microwaves to create heat. The heat destroys and removes a small amount of prostate tissue. ? Transurethral needle ablation (TUNA). This procedure uses radio frequencies to destroy and remove a small amount of  prostate tissue. ? Interstitial laser coagulation (Ashley). This procedure uses a laser to destroy and remove a small amount of prostate tissue. ? Transurethral electrovaporization (TUVP). This procedure uses electrodes to destroy and remove a small amount of prostate tissue. ? Prostatic urethral lift. This procedure inserts an implant to push the lobes of the prostate away from the urethra. Follow these instructions at home:  Take over-the-counter and prescription medicines only as told by your health care provider.  Monitor your symptoms for any changes. Contact your health care provider with any changes.  Avoid drinking large amounts of liquid before going to bed or out in public.  Avoid or reduce how much caffeine or alcohol you drink.  Give yourself time when you urinate.  Keep all follow-up visits as told by your health care provider. This is important. Contact a health care provider if:  You have unexplained back pain.  Your symptoms do not get better with treatment.  You develop side effects from the medicine you are taking.  Your urine becomes very dark or has a bad smell.  Your lower abdomen becomes distended and you have trouble passing your urine. Get help right away if:  You have a fever or chills.  You suddenly cannot urinate.  You feel lightheaded, or very dizzy, or you faint.  There are large amounts of blood or clots in the urine.  Your urinary problems become hard to manage.  You develop moderate to severe low back or flank pain. The flank is the side of your body between the ribs and the hip. These symptoms may represent a serious problem that is an emergency. Do not wait to see if the symptoms will go away. Get medical help right away. Call your local emergency services (911 in the [place] Do not drive yourself to the hospital. Summary  Benign prostatic hyperplasia (BPH) is an enlarged prostate that is caused by the normal aging process and not by  cancer.  An enlarged prostate can press on the urethra. This can make it hard to pass urine.  This condition is part of a normal aging process and is more likely to develop in men over the age of 37 years.  Get help right away if you suddenly cannot urinate. This information is not intended to replace advice given to you by your health care provider. Make sure you discuss any questions you have with your health care provider. Document Revised: 03/24/2018 Document Reviewed: 06/03/2016 Elsevier Patient Education  [place].  Tadalafil tablets (Cialis) What is this medicine? TADALAFIL (tah DA la fil) is used to treat erection problems in men. It is also used for enlargement of the prostate gland in men, a condition called benign prostatic hyperplasia or BPH. This medicine improves urine flow and reduces BPH symptoms. This medicine can also treat both erection problems and BPH when they occur together. This medicine may be used for other purposes; ask your health care provider or pharmacist if you have questions. COMMON BRAND NAME(S): Kathaleen Bury, Cialis What should I tell my health care provider before I take this medicine? They need to know if you have any of these conditions:  bleeding disorders  eye or vision problems, including a  rare inherited eye disease called retinitis pigmentosa  anatomical deformation of the penis, Peyronie's disease, or history of priapism (painful and prolonged erection)  heart disease, angina, a history of heart attack, irregular heart beats, or other heart problems  high or low blood pressure  history of blood diseases, like sickle cell anemia or leukemia  history of stomach bleeding  kidney disease  liver disease  stroke  an unusual or allergic reaction to tadalafil, other medicines, foods, dyes, or preservatives  pregnant or trying to get pregnant  breast-feeding How should I use this medicine? Take this medicine by mouth with a  glass of water. Follow the directions on the prescription label. You may take this medicine with or without meals. When this medicine is used for erection problems, your doctor may prescribe it to be taken once daily or as needed. If you are taking the medicine as needed, you may be able to have sexual activity 30 minutes after taking it and for up to 36 hours after taking it. Whether you are taking the medicine as needed or once daily, you should not take more than one dose per day. If you are taking this medicine for symptoms of benign prostatic hyperplasia (BPH) or to treat both BPH and an erection problem, take the dose once daily at about the same time each day. Do not take your medicine more often than directed. Talk to your pediatrician regarding the use of this medicine in children. Special care may be needed. Overdosage: If you think you have taken too much of this medicine contact a poison control center or emergency room at once. NOTE: This medicine is only for you. Do not share this medicine with others. What if I miss a dose? If you are taking this medicine as needed for erection problems, this does not apply. If you miss a dose while taking this medicine once daily for an erection problem, benign prostatic hyperplasia, or both, take it as soon as you remember, but do not take more than one dose per day. What may interact with this medicine? Do not take this medicine with any of the following medications:  nitrates like amyl nitrite, isosorbide dinitrate, isosorbide mononitrate, nitroglycerin  other medicines for erectile dysfunction like avanafil, sildenafil, vardenafil  other tadalafil products (Adcirca)  riociguat This medicine may also interact with the following medications:  certain drugs for high blood pressure  certain drugs for the treatment of HIV infection or AIDS  certain drugs used for fungal or yeast infections, like fluconazole, itraconazole, ketoconazole, and  voriconazole  certain drugs used for seizures like carbamazepine, phenytoin, and phenobarbital  grapefruit juice  macrolide antibiotics like clarithromycin, erythromycin, troleandomycin  medicines for prostate problems  rifabutin, rifampin or rifapentine This list may not describe all possible interactions. Give your health care provider a list of all the medicines, herbs, non-prescription drugs, or dietary supplements you use. Also tell them if you smoke, drink alcohol, or use illegal drugs. Some items may interact with your medicine. What should I watch for while using this medicine? If you notice any changes in your vision while taking this drug, call your doctor or health care professional as soon as possible. Stop using this medicine and call your health care provider right away if you have a loss of sight in one or both eyes. Contact your doctor or health care professional right away if the erection lasts longer than 4 hours or if it becomes painful. This may be a sign of serious  problem and must be treated right away to prevent permanent damage. If you experience symptoms of nausea, dizziness, chest pain or arm pain upon initiation of sexual activity after taking this medicine, you should refrain from further activity and call your doctor or health care professional as soon as possible. Do not drink alcohol to excess (examples, 5 glasses of wine or 5 shots of whiskey) when taking this medicine. When taken in excess, alcohol can increase your chances of getting a headache or getting dizzy, increasing your heart rate or lowering your blood pressure. Using this medicine does not protect you or your partner against HIV infection (the virus that causes AIDS) or other sexually transmitted diseases. What side effects may I notice from receiving this medicine? Side effects that you should report to your doctor or health care professional as soon as possible:  allergic reactions like skin rash,  itching or hives, swelling of the face, lips, or tongue  breathing problems  changes in hearing  changes in vision  chest pain  fast, irregular heartbeat  prolonged or painful erection  seizures Side effects that usually do not require medical attention (report to your doctor or health care professional if they continue or are bothersome):  back pain  dizziness  flushing  headache  indigestion  muscle aches  nausea  stuffy or runny nose This list may not describe all possible side effects. Call your doctor for medical advice about side effects. You may report side effects to FDA at 1-800-FDA-1088. Where should I keep my medicine? Keep out of the reach of children. Store at room temperature between 15 and 30 degrees C (59 and 86 degrees F). Throw away any unused medicine after the expiration date. NOTE: This sheet is a summary. It may not cover all possible information. If you have questions about this medicine, talk to your doctor, pharmacist, or health care provider.  2020 Elsevier/Gold Standard (2013-09-17 13:15:49)

## 2020-04-18 NOTE — Addendum Note (Signed)
Addended by: Lavonna Monarch on: 04/18/2020 01:29 PM   Modules accepted: Level of Service

## 2020-04-19 ENCOUNTER — Ambulatory Visit: Payer: 59

## 2020-04-20 ENCOUNTER — Ambulatory Visit: Payer: 59

## 2020-04-22 DIAGNOSIS — Z9189 Other specified personal risk factors, not elsewhere classified: Secondary | ICD-10-CM

## 2020-04-22 NOTE — Procedures (Signed)
    Patient Name: Roberto Henderson, Roberto Henderson Date: 04/14/2020 Gender: Male D.O.B: January 01, 1971 Age (years): 7 Referring Provider: Melina Schools DO Height (inches): 26 Interpreting Physician: Baird Lyons MD, ABSM Weight (lbs): 210 RPSGT: Gerhard Perches BMI: 31 MRN: [id number] Neck Size: 17.00  CLINICAL INFORMATION Sleep Study Type: HST  Indication for sleep study: N/A Epworth Sleepiness Score: 6  SLEEP STUDY TECHNIQUE A multi-channel overnight portable sleep study was performed. The channels recorded were: nasal airflow, thoracic respiratory movement, and oxygen saturation with a pulse oximetry. Snoring was also monitored.  MEDICATIONS Patient self administered medications include: N/A.  SLEEP ARCHITECTURE Patient was studied for 358.5 minutes. The sleep efficiency was 99.7 % and the patient was supine for 76.5%. The arousal index was 0.0 per hour.  RESPIRATORY PARAMETERS The overall AHI was 15.1 per hour, with a central apnea index of 0.0 per hour. The oxygen nadir was 82% during sleep.  CARDIAC DATA Mean heart rate during sleep was 61.5 bpm.  IMPRESSIONS - Moderate obstructive sleep apnea occurred during this study (AHI = 15.1/h). - No significant central sleep apnea occurred during this study (CAI = 0.0/h). - Moderate oxygen desaturation was noted during this study (Min O2 = 82%). Mean O2 sat 94% - Patient snored.  DIAGNOSIS - Obstructive Sleep Apnea (G47.33)  RECOMMENDATIONS - Suggest CPAP autopap 5-20 or in-center tiration sleep study. Other options including a fitted oral appliance, ENT evaluation or Sleep Medicine consultation would be based on clinical judgment. - Be careful with alcohol, sedatives and other CNS depressants that may worsen sleep apnea and disrupt normal sleep architecture. - Sleep hygiene should be reviewed to assess factors that may improve sleep quality. - Weight management and regular exercise should be initiated or  continued.  [Electronically signed] 04/22/2020 10:24 AM  Baird Lyons MD, ABSM Diplomate, American Board of Sleep Medicine   NPI: [id number]                        [place], Elfrida of Sleep Medicine  ELECTRONICALLY SIGNED ON:  04/22/2020, 10:21 AM [place] PH: [phone]   FX: [phone] [place]

## 2020-04-25 ENCOUNTER — Other Ambulatory Visit: Payer: Self-pay | Admitting: Internal Medicine

## 2020-04-25 DIAGNOSIS — G4733 Obstructive sleep apnea (adult) (pediatric): Secondary | ICD-10-CM

## 2020-04-26 ENCOUNTER — Other Ambulatory Visit: Payer: Self-pay | Admitting: Internal Medicine

## 2020-04-26 ENCOUNTER — Ambulatory Visit (INDEPENDENT_AMBULATORY_CARE_PROVIDER_SITE_OTHER): Payer: 59

## 2020-04-26 ENCOUNTER — Other Ambulatory Visit: Payer: Self-pay

## 2020-04-26 DIAGNOSIS — I10 Essential (primary) hypertension: Secondary | ICD-10-CM | POA: Diagnosis not present

## 2020-04-26 MED ORDER — METOPROLOL SUCCINATE ER 50 MG PO TB24
25.0000 mg | ORAL_TABLET | Freq: Every day | ORAL | 0 refills | Status: DC
Start: 2020-04-26 — End: 2020-06-29

## 2020-04-26 NOTE — Progress Notes (Signed)
Patient here for BP check. States that readings at home have been in the 140s-150s/80s-90s at home. After sitting BP was 151/83, pulse was 87. Spoke with provider who states to increase Metoprolol to 50 mg daily. Would like patient to continue monitoring readings at home & call if readings are still high. Keep appointment scheduled in February but call to be seen sooner if readings do not decrease. KWalker, CMA.

## 2020-04-26 NOTE — Telephone Encounter (Signed)
Patient notified of results & recommendations. Expressed understanding.

## 2020-05-15 ENCOUNTER — Other Ambulatory Visit: Payer: Self-pay | Admitting: Internal Medicine

## 2020-05-15 DIAGNOSIS — G4700 Insomnia, unspecified: Secondary | ICD-10-CM

## 2020-05-24 ENCOUNTER — Ambulatory Visit: Payer: 59 | Admitting: Urology

## 2020-06-07 ENCOUNTER — Ambulatory Visit: Payer: 59 | Admitting: Urology

## 2020-06-12 ENCOUNTER — Ambulatory Visit: Payer: 59 | Admitting: Internal Medicine

## 2020-06-29 ENCOUNTER — Encounter: Payer: Self-pay | Admitting: Urology

## 2020-06-29 ENCOUNTER — Other Ambulatory Visit: Payer: Self-pay

## 2020-06-29 ENCOUNTER — Ambulatory Visit (INDEPENDENT_AMBULATORY_CARE_PROVIDER_SITE_OTHER): Payer: 59 | Admitting: Internal Medicine

## 2020-06-29 ENCOUNTER — Ambulatory Visit (INDEPENDENT_AMBULATORY_CARE_PROVIDER_SITE_OTHER): Payer: 59 | Admitting: Urology

## 2020-06-29 ENCOUNTER — Ambulatory Visit: Payer: 59 | Admitting: Licensed Clinical Social Worker

## 2020-06-29 ENCOUNTER — Encounter: Payer: Self-pay | Admitting: Internal Medicine

## 2020-06-29 VITALS — BP 174/98 | HR 83 | Temp 98.9°F | Resp 18 | Ht 68.5 in | Wt 229.4 lb

## 2020-06-29 VITALS — BP 158/82 | HR 83 | Ht 68.5 in | Wt 229.0 lb

## 2020-06-29 DIAGNOSIS — G4733 Obstructive sleep apnea (adult) (pediatric): Secondary | ICD-10-CM

## 2020-06-29 DIAGNOSIS — G4700 Insomnia, unspecified: Secondary | ICD-10-CM | POA: Diagnosis not present

## 2020-06-29 DIAGNOSIS — N401 Enlarged prostate with lower urinary tract symptoms: Secondary | ICD-10-CM

## 2020-06-29 DIAGNOSIS — R3914 Feeling of incomplete bladder emptying: Secondary | ICD-10-CM

## 2020-06-29 DIAGNOSIS — F4323 Adjustment disorder with mixed anxiety and depressed mood: Secondary | ICD-10-CM

## 2020-06-29 DIAGNOSIS — I10 Essential (primary) hypertension: Secondary | ICD-10-CM

## 2020-06-29 DIAGNOSIS — F411 Generalized anxiety disorder: Secondary | ICD-10-CM | POA: Diagnosis not present

## 2020-06-29 DIAGNOSIS — R3121 Asymptomatic microscopic hematuria: Secondary | ICD-10-CM

## 2020-06-29 MED ORDER — SERTRALINE HCL 25 MG PO TABS: ORAL_TABLET | ORAL | 1 refills | Status: DC

## 2020-06-29 MED ORDER — METOPROLOL SUCCINATE ER 50 MG PO TB24: 50.0000 mg | ORAL_TABLET | Freq: Every day | ORAL | 0 refills | Status: DC

## 2020-06-29 NOTE — Patient Instructions (Signed)
Prostatic Urethral Lift  Prostatic urethral lift is a surgical procedure to relieve symptoms of prostate gland enlargement that occurs with age (benign prostatic hypertrophy, BPH). The part of the body that drains urine from the bladder (urethra) passes between the two lobes of the prostate. As the prostate enlarges, it can push on the urethra and cause problems with urinating. This procedure involves placing an implant that holds the prostate away from the urethra. The procedure is performed with a thin operating telescope (cystoscope) that is inserted through the tip of the penis and moved up the urethra to the prostate. This is less invasive than other procedures that require an incision. You may have this procedure if:  You have symptoms of BPH.  Your prostate is not severely enlarged.  Medicines to treat BPH are not working or not tolerated.  You want to avoid possible sexual side effects from medicines or other procedures that are used to treat BPH. Tell a health care provider about:  Any allergies you have.  All medicines you are taking, including vitamins, herbs, eye drops, creams, and over-the-counter medicines.  Previous problems you or members of your family have had with the use of anesthetics.  Any blood disorders you have.  Previous surgeries you have had.  Any medical conditions you have. What are the risks?  Bleeding.  Infection.  Leaking of urine (incontinence).  Allergic reactions to medicines.  Return of BPH symptoms after 2 years, requiring more treatment. What happens before the procedure?  Ask your health care provider about: ? Changing or stopping your regular medicines. This is especially important if you are taking diabetes medicines or blood thinners. ? Taking medicines such as aspirin and ibuprofen. These medicines can thin your blood. Do not take these medicines before your procedure if your health care provider instructs you not to.  Follow  instructions from your health care provider about eating or drinking restrictions.  Plan to have someone take you home from the hospital or clinic. What happens during the procedure?  To lower your risk of infection: ? Your health care team will wash or sanitize their hands. ? Your skin will be washed with soap.  An IV may be inserted into one of your veins.  You will be given one or more of the following: ? A medicine to help you relax (sedative). ? A medicine that is injected into your urethra to numb the area (local anesthetic). ? A medicine to make you fall asleep (general anesthetic).  A cystoscope will be inserted into your penis and moved through your urethra to your prostate.  A device will be inserted through the cystoscope and used to press the lobes of your prostate away from your urethra.  Implants will be inserted through the device to hold the lobes of your prostate in the widened position.  The device and cystoscope will be removed. The procedure may vary among health care providers and hospitals. What happens after the procedure?  Your blood pressure, heart rate, breathing rate, and blood oxygen level will be monitored until the medicines you were given have worn off.  Do not drive for 24 hours if you were given a sedative. Summary  Prostatic urethral lift is a surgical procedure to relieve symptoms of prostate gland enlargement that occurs with age (benign prostatic hypertrophy, BPH).  The procedure is performed with a thin operating telescope (cystoscope) that is inserted through the tip of the penis and moved up the part of the body that drains   urine from the bladder (urethra) to reach the prostate. This is less invasive than other procedures that require an incision.  Plan to have someone take you home from the hospital or clinic. You will not be allowed to drive for 24 hours if you were given a sedative during the procedure. This information is not intended to  replace advice given to you by your health care provider. Make sure you discuss any questions you have with your health care provider. Document Revised: 01/06/2020 Document Reviewed: 01/06/2020 Elsevier Patient Education  [place].   Holmium Laser Enucleation of the Prostate (HoLEP)  HoLEP is a treatment for men with benign prostatic hyperplasia (BPH). The laser surgery removed blockages of urine flow, and is done without any incisions on the body.     What is HoLEP?  HoLEP is a type of laser surgery used to treat obstruction (blockage) of urine flow as a result of benign prostatic hyperplasia (BPH). In men with BPH, the prostate gland is not cancerous, but has become enlarged. An enlarged prostate can result in a number of urinary tract symptoms such as weak urinary stream, difficulty in starting urination, inability to urinate, frequent urination, or getting up at night to urinate.  HoLEP was developed in the 1990's as a more effective and less expensive surgical option for BPH, compared to other surgical options such as laser vaporization(PVP/greenlight laser), transurethral resection of the prostate(TURP), and open simple prostatectomy.   What happens during a HoLEP?  HoLEP requires general anesthesia ("asleep" throughout the procedure).   An antibiotic is given to reduce the risk of infection  A surgical instrument called a resectoscope is inserted through the urethra (the tube that carries urine from the bladder). The resectoscope has a camera that allows the surgeon to view the internal structure of the prostate gland, and to see where the incisions are being made during surgery.  The laser is inserted into the resectoscope and is used to enucleate (free up) the enlarged prostate tissue from the capsule (outer shell) and then to seal up any blood vessels. The tissue that has been removed is pushed back into the bladder.  A morcellator is placed through the resectoscope, and  is used to suction out the prostate tissue that has been pushed into the bladder.  When the prostate tissue has been removed, the resectoscope is removed, and a foley catheter is placed to allow healing and drain the urine from the bladder.     What happens after a HoLEP?  More than 90% of patients go home the same day a few hours after surgery. Less than 10% will be admitted to the hospital overnight for observation to monitor the urine, or if they have other medical problems.  Fluid is flushed through the catheter for about 1 hour after surgery to clear any blood from the urine. It is normal to have some blood in the urine after surgery. The need for blood transfusion is extremely rare.  Eating and drinking are permitted after the procedure once the patient has fully awakened from anesthesia.  The catheter is usually removed 2-3 days after surgery- the patient will come to clinic to have the catheter removed and make sure they can urinate on their own.  It is very important to drink lots of fluids after surgery for one week to keep the bladder flushed.  At first, there may be some burning with urination, but this typically improved within a few hours to days. Most patients do  not have a significant amount of pain, and narcotic pain medications are rarely needed.  Symptoms of urinary frequency, urgency, and even leakage are NORMAL for the first few weeks after surgery as the bladder adjusts after having to work hard against blockage from the prostate for many years. This will improve, but can sometimes take several months.  The use of pelvic floor exercises (Kegel exercises) can help improve problems with urinary incontinence.   After catheter removal, patients will be seen at 6 weeks and 6 months for symptom check  No heavy lifting for at least 2-3 weeks after surgery, however patients can walk and do light activities the first day after surgery. Return to work time depends on  occupation.    What are the advantages of HoLEP?  HoLEP has been studied in many different parts of the world and has been shown to be a safe and effective procedure. Although there are many types of BPH surgeries available, HoLEP offers a unique advantage in being able to remove a large amount of tissue without any incisions on the body, even in very large prostates, while decreasing the risk of bleeding and providing tissue for pathology (to look for cancer). This decreases the need for blood transfusions during surgery, minimizes hospital stay, and reduces the risk of needing repeat treatment.  What are the side effects of HoLEP?  Temporary burning and bleeding during urination. Some blood may be seen in the urine for weeks after surgery and is part of the healing process.  Urinary incontinence (inability to control urine flow) is expected in all patients immediately after surgery and they should wear pads for the first few days/weeks. This typically improves over the course of several weeks. Performing Kegel exercises can help decrease leakage from stress maneuvers such as coughing, sneezing, or lifting. The rate of long term leakage is very low. Patients may also have leakage with urgency and this may be treated with medication. The risk of urge incontinence can be dependent on several factors including age, prostate size, symptoms, and other medical problems.  Retrograde ejaculation or "backwards ejaculation." In 75% of cases, the patient will not see any fluid during ejaculation after surgery.  Erectile function is generally not significantly affected.   What are the risks of HoLEP?  Injury to the urethra or development of scar tissue at a later date  Injury to the capsule of the prostate (typically treated with longer catheterization).  Injury to the bladder or ureteral orifices (where the urine from the kidney drains out)  Infection of the bladder, testes, or kidneys  Return of  urinary obstruction at a later date requiring another operation (<2%)  Need for blood transfusion or re-operation due to bleeding  Failure to relieve all symptoms and/or need for prolonged catheterization after surgery  5-15% of patients are found to have previously undiagnosed prostate cancer in their specimen. Prostate cancer can be treated after HoLEP.  Standard risks of anesthesia including blood clots, heart attacks, etc  When should I call my doctor?  Fever over 101.3 degrees  Inability to urinate, or large blood clots in the urine

## 2020-06-29 NOTE — Progress Notes (Signed)
Subjective:    Roberto Henderson - 50 y.o. male MRN [id number]  Date of birth: 1971/05/08  HPI  Roberto Henderson is here for HTN f/u.   Chronic HTN Disease Monitoring:  Home BP Monitoring - 130-150s/90s  Chest pain- no  Dyspnea- no Headache - no  Medications: Amlodipine 10 mg, Losartan 100 mg, Metoprolol XL 25 mg  Compliance- yes Lightheadedness- no  Edema- no    Patient reports a lot of concerns about not being able to sleep. Trazodone was prescribed in past and reports that did not help. Also endorses a lot of anxiety symptoms. Unable to turn his thoughts off at nighttime. No SI.   GAD 7 : Generalized Anxiety Score 06/29/2020 10/08/2019  Nervous, Anxious, on Edge 1 1  Control/stop worrying 1 0  Worry too much - different things 1 1  Trouble relaxing 1 1  Restless 1 0  Easily annoyed or irritable 1 0  Afraid - awful might happen 0 0  Total GAD 7 Score 6 3  Anxiety Difficulty Somewhat difficult -     Health Maintenance:  Health Maintenance Due  Topic Date Due  . COVID-19 Vaccine (3 - Pfizer risk 4-dose series) 11/01/2019    -  reports that he has never smoked. He has never used smokeless tobacco. - Review of Systems: Per HPI. - Past Medical History: Patient Active Problem List   Diagnosis Date Noted  . OSA (obstructive sleep apnea) 04/25/2020  . Benign prostatic hyperplasia with incomplete bladder emptying 01/26/2020  . Candidate for statin therapy due to risk of future cardiovascular event 10/13/2019  . Essential hypertension 10/08/2019   - Medications: reviewed and updated   Objective:   Physical Exam BP (!) 174/98 (BP Location: Right Arm, Patient Position: Sitting, Cuff Size: Large)   Pulse 83   Temp 98.9 F (37.2 C) (Oral)   Resp 18   Ht 5' 8.5" (1.74 m)   Wt 229 lb 6.4 oz (104.1 kg)   SpO2 98%   BMI 34.37 kg/m  Physical Exam Constitutional:      General: He is not in acute distress.    Appearance: He is not diaphoretic.  HENT:      Head: Normocephalic and atraumatic.  Eyes:     Extraocular Movements: EOM normal.     Conjunctiva/sclera: Conjunctivae normal.  Cardiovascular:     Rate and Rhythm: Normal rate and regular rhythm.     Heart sounds: Normal heart sounds. No murmur heard.   Pulmonary:     Effort: Pulmonary effort is normal. No respiratory distress.     Breath sounds: Normal breath sounds.  Musculoskeletal:        General: Normal range of motion.  Skin:    General: Skin is warm and dry.  Neurological:     Mental Status: He is alert and oriented to person, place, and time.  Psychiatric:        Mood and Affect: Affect normal.        Judgment: Judgment normal.            Assessment & Plan:   1. Essential hypertension BP remains above goal. Increase Metoprolol to 50 mg daily as pulse should be able to tolerate adjustment in Beta-blocker. Patient has sleep study that shows OSA. Discussed with patient at length that uncontrolled OSA can lead to uncontrolled HTN. Would recommend compliance with CPAP.  - metoprolol succinate (TOPROL-XL) 50 MG 24 hr tablet; Take 1 tablet (50 mg total) by mouth daily.  Dispense: 90 tablet; Refill: 0  2. Insomnia, unspecified type Discussed with patient that insomnia likely related or at least worsened to uncontrolled anxiety. Will start treatment as below. He has taken Ambien from another person's Rx with success. Discussed concern about risk of decreasing respiratory drive with untreated OSA with this medication and that could lead to poor outcomes and would not be good care. Encouraged to start compliance with CPAP and work on more optimal control of anxiety and we can re-address sleep aids in the future.   3. Generalized anxiety disorder Will start Zoloft for anxiety. Discussed how to take medications, potential side effects, and time for efficacy. Appointment today with Christa See, LCSW. Follow up in 4-6 weeks.  - sertraline (ZOLOFT) 25 MG tablet; Take one tablet  daily. If tolerating, increase to two tablets in one week.  Dispense: 60 tablet; Refill: 1  4. OSA (obstructive sleep apnea) - For home use only DME continuous positive airway pressure (CPAP)    Phill Myron, D.O. 06/29/2020, 9:18 AM Primary Care at [facility]

## 2020-06-29 NOTE — Progress Notes (Signed)
   06/29/2020 2:41 PM   Roberto Henderson 10/15/1970 [id number]  Reason for visit: Follow up BPH and urinary symptoms, history of microscopic hematuria  HPI: I saw Roberto Henderson back in urology clinic for his BPH and urinary symptoms.  He is a 50 year old male who had microscopic hematuria as well as some bothersome urinary symptoms with weak stream and frequency.  He underwent a cystoscopy on 04/18/2020 that showed a moderate to large prostate with a high bladder neck and obstructing lateral lobes, and mild bladder trabeculations but no other abnormalities.  Renal ultrasound showed no hydronephrosis or masses.  He previously was trialed on Flomax without any significant improvement in his urinary symptoms, and we opted for a trial of alfuzosin.  He has noticed significant improvement in his urinary symptoms on this medication, and IPSS score today is 8, with quality of life mostly satisfied which is improved from an IPSS score of 29 and quality of life unhappy prior to starting alfuzosin.  We reviewed other options for his urinary symptoms including UroLift and HOLEP, and the need to evaluate prostate volume in the future if he opted for an outlet procedure.  He satisfied with his urinary symptoms at this time, but would like to follow-up in 3 months for a symptom check and rediscuss outlet procedure options.  We will set this visit up as a transrectal ultrasound of the prostate, so we can measure his prostate volume at that visit if he is having worsening symptoms and wants to pursue an outlet procedure.  RTC 3 months with IPSS and PVR, transrectal ultrasound of prostate at that visit if persistent bothersome symptoms despite alfuzosin  [place], [place] [address] [place], [place] [address] [phone]

## 2020-06-29 NOTE — BH Specialist Note (Signed)
Integrated Behavioral Health Initial In-Person Visit  MRN: [id number] Name: Roberto Henderson  Number of [place] Clinician visits:: 1/6 Session Start time: 9:40  Session End time: 10:00 Total time: 20 minutes  Types of Service: Individual psychotherapy, Collaborative care and Health Promotion  Interpretor:No.    Warm Hand Off Completed. Via Dr. Juleen China    Subjective: Roberto Henderson is a 50 y.o. male accompanied by self. Patient was referred by Dr. Juleen China for anxiety-related sleeping troubles.. Patient reports the following symptoms/concerns: racing thoughts, trouble sleeping Duration of problem: past 6 months; Severity of problem: moderate  Objective: Mood: Anxious and Affect: Appropriate Risk of harm to self or others: No plan to harm self or others  Life Context: Family and Social: pt is going through a divorce with his wife School/Work: pt is employed  Self-Care: pt is seeking to improve sleep Life Changes: pt reports going through a divorce in the past 6 months  Patient Strengths/Protective Factors: Social and Emotional competence and Concrete supports in place (healthy food, safe environments, etc.)  Goals Addressed: Patient will: 1. Reduce symptoms of: anxiety, depression and stress by beginning Zoloft anti-depressive medication. 2. Increase knowledge and/or ability of: coping skills and healthy habits by practicing good sleep hygiene. 3. Demonstrate ability to: Increase healthy adjustment to current life circumstances by beginning therapy if desired to talk about anxiety and life changes.  Progress towards Goals: Ongoing  Interventions: Interventions utilized: Solution-Focused Strategies and Psychoeducation and/or Health Education  Standardized Assessments completed: Not Needed  Patient Response: pt was receptive to treatment plan  Patient Centered Plan: Patient is on the following Treatment Plan(s): Begin taking zoloft as proscribed by Dr.  Juleen China and follow up if therapy is desired.  Assessment: Patient currently experiencing racing thoughts at night and difficultly sleeping that has worsened since beginning a divorce with his wife 6 months ago.   Patient may benefit from beginning zoloft and contacting SW if desiring to begin therapy.  Plan: 1. Follow up with behavioral health clinician on : as needed 2. Behavioral recommendations: contact with interest in therapy services 3. Referral(s): [place] (In Clinic)  Baird Kay MSW Intern

## 2020-08-07 ENCOUNTER — Ambulatory Visit: Payer: 59 | Admitting: Internal Medicine

## 2020-09-12 ENCOUNTER — Other Ambulatory Visit: Payer: Self-pay | Admitting: *Deleted

## 2020-09-12 DIAGNOSIS — F411 Generalized anxiety disorder: Secondary | ICD-10-CM

## 2020-09-12 MED ORDER — SERTRALINE HCL 25 MG PO TABS: ORAL_TABLET | ORAL | 1 refills | Status: DC

## 2020-09-26 ENCOUNTER — Other Ambulatory Visit: Payer: Self-pay | Admitting: Physician Assistant

## 2020-09-26 DIAGNOSIS — N529 Male erectile dysfunction, unspecified: Secondary | ICD-10-CM

## 2020-09-27 ENCOUNTER — Encounter: Payer: Self-pay | Admitting: Urology

## 2020-09-29 ENCOUNTER — Encounter: Payer: Self-pay | Admitting: Urology

## 2020-10-23 ENCOUNTER — Other Ambulatory Visit: Payer: Self-pay | Admitting: Internal Medicine

## 2020-10-23 DIAGNOSIS — Z9189 Other specified personal risk factors, not elsewhere classified: Secondary | ICD-10-CM

## 2020-10-31 ENCOUNTER — Other Ambulatory Visit: Payer: Self-pay

## 2020-10-31 ENCOUNTER — Telehealth: Payer: Self-pay | Admitting: Internal Medicine

## 2020-10-31 DIAGNOSIS — I10 Essential (primary) hypertension: Secondary | ICD-10-CM

## 2020-10-31 MED ORDER — AMLODIPINE BESYLATE 10 MG PO TABS: 10.0000 mg | ORAL_TABLET | Freq: Every day | ORAL | 0 refills | Status: DC

## 2020-10-31 NOTE — Progress Notes (Signed)
Amlodipine refilled for 30 day courtesy pt needs appt for future refills

## 2020-10-31 NOTE — Telephone Encounter (Signed)
Patient came in the office stating he needs refills on his     am[facility]ODipine [facility]) [facility] [place], [place] - [address] AT [address]  [place], [place] [address]  Phone:  [phone]  Fax:  [phone]  DEA #:  [id number]     Patient is out of his medication

## 2020-11-01 ENCOUNTER — Other Ambulatory Visit: Payer: Self-pay | Admitting: Family

## 2020-11-01 DIAGNOSIS — I10 Essential (primary) hypertension: Secondary | ICD-10-CM

## 2020-12-02 ENCOUNTER — Other Ambulatory Visit: Payer: Self-pay | Admitting: Family

## 2020-12-02 DIAGNOSIS — I10 Essential (primary) hypertension: Secondary | ICD-10-CM

## 2020-12-05 ENCOUNTER — Other Ambulatory Visit: Payer: Self-pay | Admitting: Pharmacist

## 2020-12-05 DIAGNOSIS — I10 Essential (primary) hypertension: Secondary | ICD-10-CM

## 2020-12-05 MED ORDER — AMLODIPINE BESYLATE 10 MG PO TABS: 10.0000 mg | ORAL_TABLET | Freq: Every day | ORAL | 0 refills | Status: DC

## 2020-12-06 ENCOUNTER — Telehealth: Payer: 59 | Admitting: Physician Assistant

## 2021-01-02 ENCOUNTER — Other Ambulatory Visit: Payer: Self-pay | Admitting: Family Medicine

## 2021-01-02 DIAGNOSIS — I10 Essential (primary) hypertension: Secondary | ICD-10-CM

## 2021-01-05 ENCOUNTER — Other Ambulatory Visit: Payer: Self-pay

## 2021-01-05 ENCOUNTER — Telehealth (INDEPENDENT_AMBULATORY_CARE_PROVIDER_SITE_OTHER): Payer: 59 | Admitting: Family

## 2021-01-05 ENCOUNTER — Encounter: Payer: Self-pay | Admitting: Family

## 2021-01-05 DIAGNOSIS — I10 Essential (primary) hypertension: Secondary | ICD-10-CM | POA: Diagnosis not present

## 2021-01-05 MED ORDER — HYDROCHLOROTHIAZIDE 12.5 MG PO TABS
12.5000 mg | ORAL_TABLET | Freq: Every day | ORAL | 0 refills | Status: DC
Start: 2021-01-05 — End: 2021-03-05

## 2021-01-05 MED ORDER — AMLODIPINE BESYLATE 10 MG PO TABS: 10.0000 mg | ORAL_TABLET | Freq: Every day | ORAL | 0 refills | Status: DC

## 2021-01-05 MED ORDER — METOPROLOL SUCCINATE ER 50 MG PO TB24: 50.0000 mg | ORAL_TABLET | Freq: Every day | ORAL | 0 refills | Status: DC

## 2021-01-05 NOTE — Progress Notes (Signed)
Pt presents for telemedicine visit to hypertension follow-up yesterday BP reading was 162/84

## 2021-01-05 NOTE — Progress Notes (Signed)
Virtual Visit via Telephone Note  I connected with Roberto Henderson, on 01/05/2021 at 2:09 PM by telephone due to the COVID-19 pandemic and verified that I am speaking with the correct person using two identifiers.  Due to current restrictions/limitations of in-office visits due to the COVID-19 pandemic, this scheduled clinical appointment was converted to a telehealth visit.   Consent: I discussed the limitations, risks, security and privacy concerns of performing an evaluation and management service by telephone and the availability of in person appointments. I also discussed with the patient that there may be a patient responsible charge related to this service. The patient expressed understanding and agreed to proceed.   Location of Patient: Home  Location of Provider: [facility] participating in Telemedicine visit: Lanice Shirts, NP Elmon Else, [place]   History of Present Illness: Roberto Henderson is a 50 year-old male who presents for hypertension follow-up.  HYPERTENSION FOLLOW-UP: 06/29/2020 per DO note: BP remains above goal. Increase Metoprolol to 50 mg daily as pulse should be able to tolerate adjustment in Beta-blocker. Patient has sleep study that shows OSA. Discussed with patient at length that uncontrolled OSA can lead to uncontrolled HTN. Would recommend compliance with CPAP.   01/05/2021: Med Adherence: '[x]'$  Yes but recently was out of medication for at least 1 week Medication side effects: '[]'$  Yes    '[x]'$  No Adherence with salt restriction (low-salt diet): '[x]'$  Yes  '[]'$  No Exercise: trying  Home Monitoring?: '[x]'$  Yes    '[]'$  No Monitoring Frequency: '[x]'$  Yes    '[]'$  No Home BP results range: '[x]'$  Yes, 150's-160's/80's Smoking '[]'$  Yes '[x]'$  No SOB? '[]'$  Yes    '[x]'$  No Chest Pain?: '[]'$  Yes    '[x]'$  No Comments: Reports does not need Losartan refilled at this time    Past Medical History:  Diagnosis Date   Asthma    as a  teenager   Hypertension    No Known Allergies  Current Outpatient Medications on File Prior to Visit  Medication Sig Dispense Refill   alfuzosin (UROXATRAL) 10 MG 24 hr tablet Take 1 tablet (10 mg total) by mouth daily with breakfast. 30 tablet 11   amLODipine (NORVASC) 10 MG tablet Take 1 tablet (10 mg total) by mouth daily. 30 tablet 0   aspirin (EC-81 ASPIRIN) 81 MG EC tablet Take 1 tablet (81 mg total) by mouth daily. Swallow whole. 30 tablet 12   atorvastatin (LIPITOR) 40 MG tablet TAKE 1 TABLET(40 MG) BY MOUTH DAILY 90 tablet 0   losartan (COZAAR) 100 MG tablet Take 1 tablet (100 mg total) by mouth daily. 90 tablet 3   metoprolol succinate (TOPROL-XL) 50 MG 24 hr tablet Take 1 tablet (50 mg total) by mouth daily. 90 tablet 0   Multiple Vitamin (MULTIVITAMIN) tablet Take 1 tablet by mouth daily.     sildenafil (VIAGRA) 100 MG tablet TAKE 1/2 TO 1 TABLET(50 TO 100 MG) BY MOUTH DAILY AS NEEDED FOR ERECTILE DYSFUNCTION 6 tablet 1   No current facility-administered medications on file prior to visit.    Observations/Objective: Alert and oriented x 3. Not in acute distress. Physical examination not completed as this is a telemedicine visit.  Assessment and Plan: 1. Essential hypertension: - Home blood pressures not at goal.  - Continue Amlodipine, Metoprolol Succinate, and Losartan as prescribed. Patient reports he does not need refills on Losartan. - Begin Hydrochlorothiazide as prescribed.  - Counseled on blood pressure  goal of less than 130/80, low-sodium, DASH diet, medication compliance, 150 minutes of moderate intensity exercise per week as tolerated. Discussed medication compliance, adverse effects. - Follow-up at next appointment scheduled 02/05/2021 or sooner if needed.  - hydrochlorothiazide (HYDRODIURIL) 12.5 MG tablet; Take 1 tablet (12.5 mg total) by mouth daily.  Dispense: 30 tablet; Refill: 0 - amLODipine (NORVASC) 10 MG tablet; Take 1 tablet (10 mg total) by mouth daily.   Dispense: 60 tablet; Refill: 0 - metoprolol succinate (TOPROL-XL) 50 MG 24 hr tablet; Take 1 tablet (50 mg total) by mouth daily.  Dispense: 60 tablet; Refill: 0   Follow Up Instructions: Follow-up with primary provider 02/05/2021 or sooner if needed.    Patient was given clear instructions to go to Emergency Department or return to medical center if symptoms don't improve, worsen, or new problems develop.The patient verbalized understanding.  I discussed the assessment and treatment plan with the patient. The patient was provided an opportunity to ask questions and all were answered. The patient agreed with the plan and demonstrated an understanding of the instructions.   The patient was advised to call back or seek an in-person evaluation if the symptoms worsen or if the condition fails to improve as anticipated.    I provided 15 minutes total of non-face-to-face time during this encounter.   Camillia Herter, NP  [facility] [place], [place] 01/05/2021, 2:09 PM

## 2021-01-18 ENCOUNTER — Encounter: Payer: 59 | Admitting: Family

## 2021-01-25 ENCOUNTER — Encounter: Payer: 59 | Admitting: Family

## 2021-02-01 ENCOUNTER — Other Ambulatory Visit: Payer: Self-pay | Admitting: Family

## 2021-02-01 DIAGNOSIS — I10 Essential (primary) hypertension: Secondary | ICD-10-CM

## 2021-02-05 ENCOUNTER — Ambulatory Visit (INDEPENDENT_AMBULATORY_CARE_PROVIDER_SITE_OTHER): Payer: 59 | Admitting: Family Medicine

## 2021-02-05 ENCOUNTER — Encounter: Payer: Self-pay | Admitting: Family Medicine

## 2021-02-05 ENCOUNTER — Encounter: Payer: 59 | Admitting: Family

## 2021-02-05 ENCOUNTER — Other Ambulatory Visit: Payer: Self-pay

## 2021-02-05 VITALS — BP 155/100 | HR 80 | Temp 98.5°F | Resp 16 | Wt 219.0 lb

## 2021-02-05 DIAGNOSIS — I10 Essential (primary) hypertension: Secondary | ICD-10-CM

## 2021-02-05 DIAGNOSIS — G4733 Obstructive sleep apnea (adult) (pediatric): Secondary | ICD-10-CM | POA: Diagnosis not present

## 2021-02-05 DIAGNOSIS — Z9189 Other specified personal risk factors, not elsewhere classified: Secondary | ICD-10-CM | POA: Diagnosis not present

## 2021-02-05 MED ORDER — ATORVASTATIN CALCIUM 40 MG PO TABS: ORAL_TABLET | ORAL | 0 refills | Status: DC

## 2021-02-05 MED ORDER — LOSARTAN POTASSIUM 100 MG PO TABS: 100.0000 mg | ORAL_TABLET | Freq: Every day | ORAL | 0 refills | Status: DC

## 2021-02-05 MED ORDER — SPIRONOLACTONE 25 MG PO TABS: 25.0000 mg | ORAL_TABLET | Freq: Every day | ORAL | 0 refills | Status: DC

## 2021-02-05 NOTE — Progress Notes (Signed)
Patient c/o still having trouble sleeping  after doing a sleep study. Patient is waiting on CPAP. Patient needs refill

## 2021-02-06 NOTE — Progress Notes (Signed)
Established Patient Office Visit  Subjective:  Patient ID: Roberto Henderson, male    DOB: Nov 11, 1970  Age: 50 y.o. MRN: [id number]  CC:  Chief Complaint  Patient presents with   Establish Care   Annual Exam   Medication Refill   Insomnia    HPI Roberto Henderson presents for follow-up of sleep study.  Patient reports that he has not yet received his CPAP machine.  He also reports elevated blood pressure readings especially lately.  Past Medical History:  Diagnosis Date   Asthma    as a teenager   Hypertension       Social History   Socioeconomic History   Marital status: Married    Spouse name: Not on file   Number of children: Not on file   Years of education: Not on file   Highest education level: Not on file  Occupational History   Not on file  Tobacco Use   Smoking status: Never   Smokeless tobacco: Never  Vaping Use   Vaping Use: Never used  Substance and Sexual Activity   Alcohol use: Yes    Comment: occ   Drug use: Not Currently   Sexual activity: Yes    Birth control/protection: None  Other Topics Concern   Not on file  Social History Narrative   Not on file   Social Determinants of Health   Financial Resource Strain: Not on file  Food Insecurity: Not on file  Transportation Needs: Not on file  Physical Activity: Not on file  Stress: Not on file  Social Connections: Not on file  Intimate Partner Violence: Not on file    ROS Review of Systems  Constitutional:  Positive for fatigue.  Respiratory:  Negative for chest tightness and shortness of breath.   All other systems reviewed and are negative.  Objective:   Today's Vitals: BP (!) 155/100 (BP Location: Right Arm, Patient Position: Sitting, Cuff Size: Large)   Pulse 80   Temp 98.5 F (36.9 C) (Oral)   Resp 16   Wt 219 lb (99.3 kg)   HC 68" (172.7 cm)   SpO2 97%   BMI 32.81 kg/m   Physical Exam Vitals and nursing note reviewed.  Constitutional:      General: He is not in acute  distress. Cardiovascular:     Rate and Rhythm: Normal rate and regular rhythm.  Pulmonary:     Effort: Pulmonary effort is normal.     Breath sounds: Normal breath sounds.  Abdominal:     Palpations: Abdomen is soft.     Tenderness: There is no abdominal tenderness.  Musculoskeletal:     Right lower leg: No edema.     Left lower leg: No edema.  Neurological:     General: No focal deficit present.     Mental Status: He is alert and oriented to person, place, and time.    Assessment & Plan:   1. Uncontrolled hypertension Losartan 100 mg and spironolactone 25 mg p.o. daily were prescribed.  Patient referred to cardiology for further evaluation and management of resistant blood pressure.  - losartan (COZAAR) 100 MG tablet; Take 1 tablet (100 mg total) by mouth daily.  Dispense: 90 tablet; Refill: 0 - spironolactone (ALDACTONE) 25 MG tablet; Take 1 tablet (25 mg total) by mouth daily.  Dispense: 90 tablet; Refill: 0 - Ambulatory referral to Cardiology  2. Candidate for statin therapy due to risk of future cardiovascular event Lipitor 40 mg p.o. daily was refilled.  -  atorvastatin (LIPITOR) 40 MG tablet; TAKE 1 TABLET(40 MG) BY MOUTH DAILY  Dispense: 90 tablet; Refill: 0  3. OSA (obstructive sleep apnea) Order for CPAP.  Patient notified that once CPAP is obtained there is a requirement to follow-up with me within 60 to 90 days. - For home use only DME continuous positive airway pressure (CPAP)   Outpatient Encounter Medications as of 02/05/2021  Medication Sig   alfuzosin (UROXATRAL) 10 MG 24 hr tablet Take 1 tablet (10 mg total) by mouth daily with breakfast.   amLODipine (NORVASC) 10 MG tablet Take 1 tablet (10 mg total) by mouth daily.   aspirin (EC-81 ASPIRIN) 81 MG EC tablet Take 1 tablet (81 mg total) by mouth daily. Swallow whole.   metoprolol succinate (TOPROL-XL) 50 MG 24 hr tablet Take 1 tablet (50 mg total) by mouth daily.   Multiple Vitamin (MULTIVITAMIN) tablet Take 1  tablet by mouth daily.   sildenafil (VIAGRA) 100 MG tablet TAKE 1/2 TO 1 TABLET(50 TO 100 MG) BY MOUTH DAILY AS NEEDED FOR ERECTILE DYSFUNCTION   spironolactone (ALDACTONE) 25 MG tablet Take 1 tablet (25 mg total) by mouth daily.   [DISCONTINUED] atorvastatin (LIPITOR) 40 MG tablet TAKE 1 TABLET(40 MG) BY MOUTH DAILY   [DISCONTINUED] losartan (COZAAR) 100 MG tablet Take 1 tablet (100 mg total) by mouth daily.   atorvastatin (LIPITOR) 40 MG tablet TAKE 1 TABLET(40 MG) BY MOUTH DAILY   hydrochlorothiazide (HYDRODIURIL) 12.5 MG tablet Take 1 tablet (12.5 mg total) by mouth daily.   losartan (COZAAR) 100 MG tablet Take 1 tablet (100 mg total) by mouth daily.   No facility-administered encounter medications on file as of 02/05/2021.    Follow-up: No follow-ups on file.   Becky Sax, MD

## 2021-02-09 ENCOUNTER — Other Ambulatory Visit: Payer: Self-pay | Admitting: *Deleted

## 2021-02-09 ENCOUNTER — Telehealth: Payer: Self-pay | Admitting: Family Medicine

## 2021-02-09 ENCOUNTER — Other Ambulatory Visit: Payer: Self-pay | Admitting: Family

## 2021-02-09 DIAGNOSIS — I10 Essential (primary) hypertension: Secondary | ICD-10-CM

## 2021-02-09 MED ORDER — METOPROLOL SUCCINATE ER 50 MG PO TB24: 50.0000 mg | ORAL_TABLET | Freq: Every day | ORAL | 0 refills | Status: DC

## 2021-02-09 NOTE — Telephone Encounter (Signed)
Patient called in stating he needs a refill on his medication  metoprolol succinate (TOPROL-XL) 50 MG 24 hr tablet   Pharmacy  [facility] [phone] - [place], [address] AT [address]  [place], [place] [address]  Phone:  [phone]  Fax:  [phone]  DEA #:  [id number]

## 2021-03-02 ENCOUNTER — Other Ambulatory Visit: Payer: Self-pay | Admitting: Family

## 2021-03-02 DIAGNOSIS — I10 Essential (primary) hypertension: Secondary | ICD-10-CM

## 2021-03-05 ENCOUNTER — Encounter (HOSPITAL_BASED_OUTPATIENT_CLINIC_OR_DEPARTMENT_OTHER): Payer: Self-pay | Admitting: Cardiology

## 2021-03-05 ENCOUNTER — Other Ambulatory Visit: Payer: Self-pay

## 2021-03-05 ENCOUNTER — Ambulatory Visit (INDEPENDENT_AMBULATORY_CARE_PROVIDER_SITE_OTHER): Payer: 59 | Admitting: Cardiology

## 2021-03-05 VITALS — BP 120/84 | HR 68 | Ht 68.0 in | Wt 233.1 lb

## 2021-03-05 DIAGNOSIS — N1831 Chronic kidney disease, stage 3a: Secondary | ICD-10-CM

## 2021-03-05 DIAGNOSIS — G4733 Obstructive sleep apnea (adult) (pediatric): Secondary | ICD-10-CM | POA: Diagnosis not present

## 2021-03-05 DIAGNOSIS — I10 Essential (primary) hypertension: Secondary | ICD-10-CM | POA: Diagnosis not present

## 2021-03-05 DIAGNOSIS — R011 Cardiac murmur, unspecified: Secondary | ICD-10-CM

## 2021-03-05 MED ORDER — TELMISARTAN 80 MG PO TABS: 80.0000 mg | ORAL_TABLET | Freq: Every day | ORAL | 3 refills | Status: DC

## 2021-03-05 MED ORDER — HYDROCHLOROTHIAZIDE 25 MG PO TABS: 25.0000 mg | ORAL_TABLET | Freq: Every day | ORAL | 3 refills | Status: DC

## 2021-03-05 NOTE — Progress Notes (Signed)
Cardiology Office Note:    Date:  03/05/2021   ID:  Roberto Henderson, DOB Nov 03, 1970, MRN [id number]  PCP:  Dorna Mai, MD   [facility] Providers Cardiologist:  None     Referring MD: Dorna Mai, MD   History of Present Illness:    Roberto Henderson is a 50 y.o. male here for the evaluation of uncontrolled hypertension at the request of Dr. Redmond Pulling.  He was last seen by Dr. Redmond Pulling 02/05/2021, and reported elevated blood pressure readings. At that visit, his blood pressure was 155/100. He was started on 100 mg losartan and 25 mg spironolactone daily, and referred to cardiology. Also, he had not yet received his CPAP.  Today: He reports he has been struggling with hypertension for years. At home, his blood pressure was 145/90 this morning. However, he is unsure of how accurately he is performing the readings. Generally, he remains asymptomatic with higher blood pressures.  He believes his Father had several heart attacks.  He denies any palpitations, chest pain, or shortness of breath. No lightheadedness, headaches, syncope, orthopnea, or PND. Also has no lower extremity edema or exertional symptoms.   Past Medical History:  Diagnosis Date   Asthma    as a teenager   Hypertension     Past Surgical History:  Procedure Laterality Date   NO PAST SURGERIES      Current Medications: Current Meds  Medication Sig   alfuzosin (UROXATRAL) 10 MG 24 hr tablet Take 1 tablet (10 mg total) by mouth daily with breakfast.   amLODipine (NORVASC) 10 MG tablet TAKE 1 TABLET(10 MG) BY MOUTH DAILY   aspirin (EC-81 ASPIRIN) 81 MG EC tablet Take 1 tablet (81 mg total) by mouth daily. Swallow whole.   atorvastatin (LIPITOR) 40 MG tablet TAKE 1 TABLET(40 MG) BY MOUTH DAILY   hydrochlorothiazide (HYDRODIURIL) 25 MG tablet Take 1 tablet (25 mg total) by mouth daily.   metoprolol succinate (TOPROL-XL) 50 MG 24 hr tablet Take 1 tablet (50 mg total) by mouth daily.   Multiple Vitamin  (MULTIVITAMIN) tablet Take 1 tablet by mouth daily.   spironolactone (ALDACTONE) 25 MG tablet Take 1 tablet (25 mg total) by mouth daily.   telmisartan (MICARDIS) 80 MG tablet Take 1 tablet (80 mg total) by mouth daily.   [DISCONTINUED] losartan (COZAAR) 100 MG tablet Take 1 tablet (100 mg total) by mouth daily.     Allergies:   Patient has no known allergies.   Social History   Socioeconomic History   Marital status: Married    Spouse name: Not on file   Number of children: Not on file   Years of education: Not on file   Highest education level: Not on file  Occupational History   Not on file  Tobacco Use   Smoking status: Never   Smokeless tobacco: Never  Vaping Use   Vaping Use: Never used  Substance and Sexual Activity   Alcohol use: Yes    Comment: occ   Drug use: Not Currently   Sexual activity: Yes    Birth control/protection: None  Other Topics Concern   Not on file  Social History Narrative   Not on file   Social Determinants of Health   Financial Resource Strain: Not on file  Food Insecurity: Not on file  Transportation Needs: Not on file  Physical Activity: Not on file  Stress: Not on file  Social Connections: Not on file     Family History: The patient's family history  includes Colon polyps in his mother. There is no history of Colon cancer, Esophageal cancer, Rectal cancer, or Stomach cancer.  ROS:   Please see the history of present illness.    All other systems reviewed and are negative.  EKGs/Labs/Other Studies Reviewed:    The following studies were reviewed today: No prior cardiovascular studies available.   EKG:  EKG is personally reviewed and interpreted. 03/05/2021: Sinus rhythm.  T wave abnormality, inferior.  Recent Labs: 03/28/2020: ALT 33; BUN 14; Creatinine, Ser 1.47; Potassium 4.0; Sodium 144; TSH 1.570   Recent Lipid Panel    Component Value Date/Time   CHOL 90 (L) 03/28/2020 0929   TRIG 50 03/28/2020 0929   HDL 49  03/28/2020 0929   CHOLHDL 1.8 03/28/2020 0929   LDLCALC 28 03/28/2020 0929     Risk Assessment/Calculations:          Physical Exam:    VS:  BP 120/84   Pulse 68   Ht 5\' 8"  (1.727 m)   Wt 233 lb 1.6 oz (105.7 kg)   SpO2 99%   BMI 35.44 kg/m     Wt Readings from Last 3 Encounters:  03/05/21 233 lb 1.6 oz (105.7 kg)  02/05/21 219 lb (99.3 kg)  06/29/20 229 lb (103.9 kg)     GEN: Well nourished, well developed in no acute distress HEENT: Normal NECK: No JVD; No carotid bruits LYMPHATICS: No lymphadenopathy CARDIAC: RRR, no murmurs, rubs, gallops RESPIRATORY:  Clear to auscultation without rales, wheezing or rhonchi  ABDOMEN: Soft, non-tender, non-distended MUSCULOSKELETAL:  No edema; No deformity  SKIN: Warm and dry NEUROLOGIC:  Alert and oriented x 3 PSYCHIATRIC:  Normal affect   ASSESSMENT:    1. Essential hypertension   2. Murmur   3. OSA (obstructive sleep apnea)   4. Chronic kidney disease, stage 3a ([place])   5. Heart murmur    PLAN:    In order of problems listed above:  Essential hypertension Difficult to control hypertension for quite some time.  He is on a multidrug regimen including most recently spironolactone 25.  I will make a few changes today, changing his losartan 100 over to telmisartan 80 mg daily.  This ARB can be slightly more effective at blood pressure control.  I will also increase his hydrochlorothiazide up to 25 mg a day.  Continue with the amlodipine 10 mg a day as well as Toprol-XL 50.  And in approximately 2 months we will have him sit down with the pharmacy team, hypertension team to adjust.  We may wish to change his Toprol-XL 50 mg over to carvedilol 12.5 mg twice a day at next visit.  OSA (obstructive sleep apnea) Continue with treatment per primary team.  Excellent.  Candidate for statin therapy due to risk of future cardiovascular event Excellent use of atorvastatin.  His LDL is 28.  Chronic kidney disease, stage 3a  ([place]) Continue to monitor creatinine closely.  Continue to work on hypertension control.  Heart murmur Soft systolic murmur appreciated.  With EKG changes, we will check echocardiogram.  Look for signs of hypertrophy.    Follow-up: 1-2 months with PharmD.  Medication Adjustments/Labs and Tests Ordered: Current medicines are reviewed at length with the patient today.  Concerns regarding medicines are outlined above.   Orders Placed This Encounter  Procedures   AMB Referral to [facility] Pharm-D   EKG 12-Lead   ECHOCARDIOGRAM COMPLETE    Meds ordered this encounter  Medications   hydrochlorothiazide (HYDRODIURIL) 25 MG tablet  Sig: Take 1 tablet (25 mg total) by mouth daily.    Dispense:  90 tablet    Refill:  3   telmisartan (MICARDIS) 80 MG tablet    Sig: Take 1 tablet (80 mg total) by mouth daily.    Dispense:  90 tablet    Refill:  3    Patient Instructions  Medication Instructions:  Please increase your HCTZ to 25 mg a day. Discontinue your Losartan and start Telmisartan 80 mg a day. Continue all other medications as listed.  *If you need a refill on your cardiac medications before your next appointment, please call your pharmacy*  Testing/Procedures: Your physician has requested that you have an echocardiogram. Echocardiography is a painless test that uses sound waves to create images of your heart. It provides your doctor with information about the size and shape of your heart and how well your heart's chambers and valves are working. This procedure takes approximately one hour. There are no restrictions for this procedure.  Follow-Up: At [facility], you and your health needs are our priority.  As part of our continuing mission to provide you with exceptional heart care, we have created designated Provider Care Teams.  These Care Teams include your primary Cardiologist (physician) and Advanced Practice Providers (APPs -  Physician Assistants and Nurse  Practitioners) who all work together to provide you with the care you need, when you need it.  We recommend signing up for the patient portal called "MyChart".  Sign up information is provided on this After Visit Summary.  MyChart is used to connect with patients for Virtual Visits (Telemedicine).  Patients are able to view lab/test results, encounter notes, upcoming appointments, etc.  Non-urgent messages can be sent to your provider as well.   To learn more about what you can do with MyChart, go to [email or web address].    Your next appointment:   2 month(s)  The format for your next appointment:   In Person  Provider:   Pharmacist at [facility] in [facility]  Thank you for choosing [facility]!!     I,Mathew Stumpf,acting as a scribe for [employer], MD.,have documented all relevant documentation on the behalf of Candee Furbish, MD,as directed by  Candee Furbish, MD while in the presence of Candee Furbish, MD.  I, Candee Furbish, MD, have reviewed all documentation for this visit. The documentation on 03/05/21 for the exam, diagnosis, procedures, and orders are all accurate and complete.   Signed, Candee Furbish, MD  03/05/2021 3:43 PM    [place]

## 2021-03-05 NOTE — Assessment & Plan Note (Signed)
Continue with treatment per primary team.  Excellent.

## 2021-03-05 NOTE — Assessment & Plan Note (Addendum)
Soft systolic murmur appreciated.  With EKG changes, we will check echocardiogram.  Look for signs of hypertrophy.

## 2021-03-05 NOTE — Assessment & Plan Note (Signed)
Difficult to control hypertension for quite some time.  He is on a multidrug regimen including most recently spironolactone 25.  I will make a few changes today, changing his losartan 100 over to telmisartan 80 mg daily.  This ARB can be slightly more effective at blood pressure control.  I will also increase his hydrochlorothiazide up to 25 mg a day.  Continue with the amlodipine 10 mg a day as well as Toprol-XL 50.  And in approximately 2 months we will have him sit down with the pharmacy team, hypertension team to adjust.  We may wish to change his Toprol-XL 50 mg over to carvedilol 12.5 mg twice a day at next visit.

## 2021-03-05 NOTE — Patient Instructions (Addendum)
Medication Instructions:  Please increase your HCTZ to 25 mg a day. Discontinue your Losartan and start Telmisartan 80 mg a day. Continue all other medications as listed.  *If you need a refill on your cardiac medications before your next appointment, please call your pharmacy*  Testing/Procedures: Your physician has requested that you have an echocardiogram. Echocardiography is a painless test that uses sound waves to create images of your heart. It provides your doctor with information about the size and shape of your heart and how well your heart's chambers and valves are working. This procedure takes approximately one hour. There are no restrictions for this procedure.  Follow-Up: At [facility], you and your health needs are our priority.  As part of our continuing mission to provide you with exceptional heart care, we have created designated Provider Care Teams.  These Care Teams include your primary Cardiologist (physician) and Advanced Practice Providers (APPs -  Physician Assistants and Nurse Practitioners) who all work together to provide you with the care you need, when you need it.  We recommend signing up for the patient portal called "MyChart".  Sign up information is provided on this After Visit Summary.  MyChart is used to connect with patients for Virtual Visits (Telemedicine).  Patients are able to view lab/test results, encounter notes, upcoming appointments, etc.  Non-urgent messages can be sent to your provider as well.   To learn more about what you can do with MyChart, go to [email or web address].    Your next appointment:   2 month(s)  The format for your next appointment:   In Person  Provider:   Pharmacist at [facility] in Hy[facility]ension Clinic  Thank you for choosing [facility]!!

## 2021-03-05 NOTE — Assessment & Plan Note (Signed)
Continue to monitor creatinine closely.  Continue to work on hypertension control.

## 2021-03-05 NOTE — Assessment & Plan Note (Signed)
Excellent use of atorvastatin.  His LDL is 28.

## 2021-03-12 ENCOUNTER — Other Ambulatory Visit: Payer: Self-pay

## 2021-03-12 ENCOUNTER — Encounter: Payer: Self-pay | Admitting: Family Medicine

## 2021-03-12 ENCOUNTER — Ambulatory Visit (INDEPENDENT_AMBULATORY_CARE_PROVIDER_SITE_OTHER): Payer: 59 | Admitting: Family Medicine

## 2021-03-12 VITALS — BP 146/84 | HR 79 | Temp 98.2°F | Resp 16 | Wt 232.2 lb

## 2021-03-12 DIAGNOSIS — E6609 Other obesity due to excess calories: Secondary | ICD-10-CM | POA: Diagnosis not present

## 2021-03-12 DIAGNOSIS — I10 Essential (primary) hypertension: Secondary | ICD-10-CM

## 2021-03-12 DIAGNOSIS — Z6835 Body mass index (BMI) 35.0-35.9, adult: Secondary | ICD-10-CM

## 2021-03-12 NOTE — Progress Notes (Signed)
Patient is here for f/up. Patient has no concerns today.

## 2021-03-12 NOTE — Progress Notes (Signed)
Established Patient Office Visit  Subjective:  Patient ID: Roberto Henderson, male    DOB: 09-23-1970  Age: 50 y.o. MRN: [id number]  CC:  Chief Complaint  Patient presents with   Hypertension    HPI Roberto Henderson presents for follow up of hypertension. Denies acute complaints or concerns.   Past Medical History:  Diagnosis Date   Asthma    as a teenager   Hypertension       Social History   Socioeconomic History   Marital status: Married    Spouse name: Not on file   Number of children: Not on file   Years of education: Not on file   Highest education level: Not on file  Occupational History   Not on file  Tobacco Use   Smoking status: Never   Smokeless tobacco: Never  Vaping Use   Vaping Use: Never used  Substance and Sexual Activity   Alcohol use: Yes    Comment: occ   Drug use: Not Currently   Sexual activity: Yes    Birth control/protection: None  Other Topics Concern   Not on file  Social History Narrative   Not on file   Social Determinants of Health   Financial Resource Strain: Not on file  Food Insecurity: Not on file  Transportation Needs: Not on file  Physical Activity: Not on file  Stress: Not on file  Social Connections: Not on file  Intimate Partner Violence: Not on file    ROS Review of Systems  All other systems reviewed and are negative.  Objective:   Today's Vitals: BP (!) 146/84 (BP Location: Right Arm, Patient Position: Sitting, Cuff Size: Large)   Pulse 79   Temp 98.2 F (36.8 C) (Oral)   Resp 16   Wt 232 lb 3.2 oz (105.3 kg)   SpO2 96%   BMI 35.31 kg/m   Physical Exam Vitals and nursing note reviewed.  Constitutional:      General: He is not in acute distress.    Appearance: He is obese.  Cardiovascular:     Rate and Rhythm: Normal rate and regular rhythm.  Pulmonary:     Effort: Pulmonary effort is normal.     Breath sounds: Normal breath sounds.  Abdominal:     Palpations: Abdomen is soft.     Tenderness:  There is no abdominal tenderness.  Musculoskeletal:     Right lower leg: No edema.     Left lower leg: No edema.  Neurological:     General: No focal deficit present.     Mental Status: He is alert and oriented to person, place, and time.    Assessment & Plan:   1. Essential hypertension Improved readings. Recent adjustments have been made by consultant. Will defer further management to consultant at this time.   2. Class 2 obesity due to excess calories without serious comorbidity with body mass index (BMI) of 35.0 to 35.9 in adult     Outpatient Encounter Medications as of 03/12/2021  Medication Sig   alfuzosin (UROXATRAL) 10 MG 24 hr tablet Take 1 tablet (10 mg total) by mouth daily with breakfast.   amLODipine (NORVASC) 10 MG tablet TAKE 1 TABLET(10 MG) BY MOUTH DAILY   aspirin (EC-81 ASPIRIN) 81 MG EC tablet Take 1 tablet (81 mg total) by mouth daily. Swallow whole.   atorvastatin (LIPITOR) 40 MG tablet TAKE 1 TABLET(40 MG) BY MOUTH DAILY   hydrochlorothiazide (HYDRODIURIL) 25 MG tablet Take 1 tablet (25 mg  total) by mouth daily.   metoprolol succinate (TOPROL-XL) 50 MG 24 hr tablet Take 1 tablet (50 mg total) by mouth daily.   Multiple Vitamin (MULTIVITAMIN) tablet Take 1 tablet by mouth daily.   spironolactone (ALDACTONE) 25 MG tablet Take 1 tablet (25 mg total) by mouth daily.   tadalafil (CIALIS) 20 MG tablet Take by mouth.   telmisartan (MICARDIS) 80 MG tablet Take 1 tablet (80 mg total) by mouth daily.   sildenafil (VIAGRA) 100 MG tablet TAKE 1/2 TO 1 TABLET(50 TO 100 MG) BY MOUTH DAILY AS NEEDED FOR ERECTILE DYSFUNCTION (Patient not taking: No sig reported)   No facility-administered encounter medications on file as of 03/12/2021.    Follow-up: Return in about 6 months (around 09/09/2021) for follow up.   Becky Sax, MD

## 2021-03-13 ENCOUNTER — Other Ambulatory Visit (HOSPITAL_BASED_OUTPATIENT_CLINIC_OR_DEPARTMENT_OTHER): Payer: 59

## 2021-03-15 ENCOUNTER — Other Ambulatory Visit: Payer: Self-pay | Admitting: Family Medicine

## 2021-03-15 ENCOUNTER — Other Ambulatory Visit: Payer: Self-pay | Admitting: *Deleted

## 2021-03-15 DIAGNOSIS — G4733 Obstructive sleep apnea (adult) (pediatric): Secondary | ICD-10-CM

## 2021-03-20 ENCOUNTER — Other Ambulatory Visit (HOSPITAL_BASED_OUTPATIENT_CLINIC_OR_DEPARTMENT_OTHER): Payer: 59

## 2021-03-21 ENCOUNTER — Encounter: Payer: Self-pay | Admitting: Family Medicine

## 2021-04-02 ENCOUNTER — Other Ambulatory Visit: Payer: Self-pay | Admitting: *Deleted

## 2021-04-02 ENCOUNTER — Other Ambulatory Visit: Payer: Self-pay | Admitting: Family Medicine

## 2021-04-02 DIAGNOSIS — I10 Essential (primary) hypertension: Secondary | ICD-10-CM

## 2021-04-21 ENCOUNTER — Other Ambulatory Visit: Payer: Self-pay | Admitting: Urology

## 2021-04-28 ENCOUNTER — Other Ambulatory Visit: Payer: Self-pay | Admitting: Family Medicine

## 2021-04-28 ENCOUNTER — Other Ambulatory Visit: Payer: Self-pay | Admitting: Urology

## 2021-04-28 DIAGNOSIS — Z9189 Other specified personal risk factors, not elsewhere classified: Secondary | ICD-10-CM

## 2021-05-08 ENCOUNTER — Ambulatory Visit: Payer: 59

## 2021-05-08 ENCOUNTER — Other Ambulatory Visit: Payer: Self-pay | Admitting: Family Medicine

## 2021-05-08 DIAGNOSIS — I10 Essential (primary) hypertension: Secondary | ICD-10-CM

## 2021-05-08 NOTE — Progress Notes (Deleted)
Patient ID: Roberto Henderson                 DOB: [birth year]                      MRN: [id number]     HPI: Roberto Henderson is a 50 y.o. male referred by Dr. Marlou Porch to HTN clinic. PMH is significant for HTN, OSA and  CKD. He has a hx of difficult to control HTN. Unsure about the accuracy of him home measurements. At last visit with Dr. Marlou Porch on 03/05/21 his losartan was changed to telmisartan 80mg  and his HCTZ was increased to 25mg  daily.  Has both losartan and temlisartan on med list Needs labs!  Current HTN meds: metoprolol succinate 50mg  daily, spironolactone 25mg  daily, amlodipine 10mg  daily, HCTZ 25mg  daily, telmisartan 80mg  daily Previously tried:  BP goal: <130/80  Family History: The patient's family history includes Colon polyps in his mother. There is no history of Colon cancer, Esophageal cancer, Rectal cancer, or Stomach cancer.  Social History:   Diet:   Exercise:   Home BP readings:   Wt Readings from Last 3 Encounters:  03/12/21 232 lb 3.2 oz (105.3 kg)  03/05/21 233 lb 1.6 oz (105.7 kg)  02/05/21 219 lb (99.3 kg)   BP Readings from Last 3 Encounters:  03/12/21 (!) 146/84  03/05/21 120/84  02/05/21 (!) 155/100   Pulse Readings from Last 3 Encounters:  03/12/21 79  03/05/21 68  02/05/21 80    Renal function: CrCl cannot be calculated (Patient's most recent lab result is older than the maximum 21 days allowed.).  Past Medical History:  Diagnosis Date   Asthma    as a teenager   Hypertension     Current Outpatient Medications on File Prior to Visit  Medication Sig Dispense Refill   alfuzosin (UROXATRAL) 10 MG 24 hr tablet Take 1 tablet (10 mg total) by mouth daily with breakfast. 30 tablet 11   amLODipine (NORVASC) 10 MG tablet TAKE 1 TABLET(10 MG) BY MOUTH DAILY 60 tablet 0   aspirin (EC-81 ASPIRIN) 81 MG EC tablet Take 1 tablet (81 mg total) by mouth daily. Swallow whole. 30 tablet 12   atorvastatin (LIPITOR) 40 MG tablet TAKE 1 TABLET(40 MG) BY MOUTH  DAILY 90 tablet 0   hydrochlorothiazide (HYDRODIURIL) 25 MG tablet Take 1 tablet (25 mg total) by mouth daily. 90 tablet 3   losartan (COZAAR) 100 MG tablet Take 100 mg by mouth daily.     Multiple Vitamin (MULTIVITAMIN) tablet Take 1 tablet by mouth daily.     sildenafil (VIAGRA) 100 MG tablet TAKE 1/2 TO 1 TABLET(50 TO 100 MG) BY MOUTH DAILY AS NEEDED FOR ERECTILE DYSFUNCTION (Patient not taking: No sig reported) 6 tablet 1   spironolactone (ALDACTONE) 25 MG tablet TAKE 1 TABLET(25 MG) BY MOUTH DAILY 90 tablet 0   tadalafil (CIALIS) 20 MG tablet Take by mouth.     telmisartan (MICARDIS) 80 MG tablet Take 1 tablet (80 mg total) by mouth daily. 90 tablet 3   No current facility-administered medications on file prior to visit.    No Known Allergies   Assessment/Plan:  1. Hypertension -

## 2021-05-21 ENCOUNTER — Other Ambulatory Visit: Payer: Self-pay | Admitting: Family Medicine

## 2021-05-21 ENCOUNTER — Other Ambulatory Visit: Payer: Self-pay | Admitting: *Deleted

## 2021-05-21 DIAGNOSIS — I10 Essential (primary) hypertension: Secondary | ICD-10-CM

## 2021-05-21 MED ORDER — SPIRONOLACTONE 25 MG PO TABS: ORAL_TABLET | ORAL | 1 refills | Status: DC

## 2021-05-24 ENCOUNTER — Encounter (HOSPITAL_BASED_OUTPATIENT_CLINIC_OR_DEPARTMENT_OTHER): Payer: Self-pay | Admitting: Cardiology

## 2021-05-24 ENCOUNTER — Telehealth (HOSPITAL_BASED_OUTPATIENT_CLINIC_OR_DEPARTMENT_OTHER): Payer: Self-pay | Admitting: Cardiology

## 2021-05-24 NOTE — Telephone Encounter (Signed)
Patient has cancelled and No Showed for his Echocardiogram appointment several time.  Several attempt have been made to contact the patient including a letter requesting he call,.  No Respoinse

## 2021-06-10 ENCOUNTER — Other Ambulatory Visit: Payer: Self-pay | Admitting: Family Medicine

## 2021-06-10 DIAGNOSIS — I10 Essential (primary) hypertension: Secondary | ICD-10-CM

## 2021-06-14 ENCOUNTER — Other Ambulatory Visit: Payer: Self-pay | Admitting: Urology

## 2021-06-22 ENCOUNTER — Other Ambulatory Visit: Payer: Self-pay | Admitting: Urology

## 2021-07-02 ENCOUNTER — Other Ambulatory Visit: Payer: Self-pay | Admitting: Family Medicine

## 2021-07-02 ENCOUNTER — Other Ambulatory Visit: Payer: Self-pay | Admitting: Urology

## 2021-07-03 ENCOUNTER — Other Ambulatory Visit: Payer: Self-pay | Admitting: Family Medicine

## 2021-07-05 ENCOUNTER — Encounter: Payer: Self-pay | Admitting: Urology

## 2021-07-05 ENCOUNTER — Other Ambulatory Visit: Payer: Self-pay

## 2021-07-05 ENCOUNTER — Ambulatory Visit (INDEPENDENT_AMBULATORY_CARE_PROVIDER_SITE_OTHER): Payer: 59 | Admitting: Urology

## 2021-07-05 VITALS — BP 171/96 | HR 87 | Ht 68.0 in | Wt 232.0 lb

## 2021-07-05 DIAGNOSIS — R3914 Feeling of incomplete bladder emptying: Secondary | ICD-10-CM | POA: Diagnosis not present

## 2021-07-05 DIAGNOSIS — Z125 Encounter for screening for malignant neoplasm of prostate: Secondary | ICD-10-CM | POA: Diagnosis not present

## 2021-07-05 DIAGNOSIS — N529 Male erectile dysfunction, unspecified: Secondary | ICD-10-CM | POA: Diagnosis not present

## 2021-07-05 DIAGNOSIS — N401 Enlarged prostate with lower urinary tract symptoms: Secondary | ICD-10-CM

## 2021-07-05 LAB — BLADDER SCAN AMB NON-IMAGING: Scan Result: 14

## 2021-07-05 MED ORDER — SILDENAFIL CITRATE 100 MG PO TABS: 50.0000 mg | ORAL_TABLET | ORAL | 11 refills | Status: DC | PRN

## 2021-07-05 MED ORDER — ALFUZOSIN HCL ER 10 MG PO TB24: 10.0000 mg | ORAL_TABLET | Freq: Every day | ORAL | 11 refills | Status: DC

## 2021-07-05 NOTE — Progress Notes (Signed)
° °  07/05/2021 9:07 AM   Roberto Henderson [birth year] [id number]  Reason for visit: Follow up BPH, ED, PSA screening  HPI: 51 year old male with urinary symptoms of urinary frequency during the day and weak stream who has had significant improvement on alfuzosin.  He has been out of this medication for the last month and his symptoms have worsened.  IPSS score today is 6, with quality of life mixed, and PVR is normal at 14 mL.  He previously underwent a cystoscopy in December 2021 that showed a moderate to large prostate with a high bladder neck and lateral lobe hypertrophy and mild trabeculations, and he opted to hold off on any consideration of an outlet procedure.  We had previously discussed need for a transrectal ultrasound to evaluate prostate volume prior to pursuing any outlet procedure.   He drinks primarily water during the day, and some coffee, and we reviewed behavioral strategies and avoiding bladder irritants. He would like to continue the alfuzosin for now.  In terms of the erections, he has taken sildenafil and Cialis in the past.  He felt like the sildenafil worked better, and is hoping for a refill of that medication.  He is unsure of the dosing, but he thinks he was taking 20 mg and this was not quite sufficient.  Risks and benefits discussed, I recommended 50 to 100 mg sildenafil on demand.  Finally, regarding PSA screening, most recent PSA was normal at 2.2 in September 2021, and per the AUA guidelines would recommend continuing screening every 2 years and would be due for repeat PSA at next visit.  Alfuzosin refilled-> consider transrectal ultrasound in the future if worsening symptoms and consideration of outlet procedure Sildenafil filled 50 to 100 mg on demand for ED RTC 1 year IPSS, PVR, PSA    Billey Co, MD  [place] [address] [place],  [address] [phone]

## 2021-07-19 ENCOUNTER — Other Ambulatory Visit: Payer: Self-pay | Admitting: Urology

## 2021-07-25 ENCOUNTER — Other Ambulatory Visit: Payer: Self-pay | Admitting: Urology

## 2021-08-08 ENCOUNTER — Other Ambulatory Visit: Payer: Self-pay | Admitting: Family Medicine

## 2021-08-08 DIAGNOSIS — Z9189 Other specified personal risk factors, not elsewhere classified: Secondary | ICD-10-CM

## 2021-08-08 DIAGNOSIS — I10 Essential (primary) hypertension: Secondary | ICD-10-CM

## 2021-09-03 ENCOUNTER — Ambulatory Visit: Payer: 59 | Admitting: Family Medicine

## 2021-10-16 ENCOUNTER — Other Ambulatory Visit: Payer: Self-pay | Admitting: Family Medicine

## 2021-10-16 DIAGNOSIS — I10 Essential (primary) hypertension: Secondary | ICD-10-CM

## 2021-10-16 NOTE — Telephone Encounter (Signed)
Schedule appointment?

## 2021-10-17 NOTE — Telephone Encounter (Signed)
error 

## 2021-10-18 ENCOUNTER — Telehealth: Payer: Self-pay | Admitting: Family Medicine

## 2021-10-18 NOTE — Telephone Encounter (Signed)
Called to schedule appt for Med refills. No answer, LVM to call back to schedule this.

## 2021-11-11 ENCOUNTER — Other Ambulatory Visit: Payer: Self-pay | Admitting: Family Medicine

## 2021-11-11 DIAGNOSIS — Z9189 Other specified personal risk factors, not elsewhere classified: Secondary | ICD-10-CM

## 2021-11-11 DIAGNOSIS — I10 Essential (primary) hypertension: Secondary | ICD-10-CM

## 2021-11-21 ENCOUNTER — Other Ambulatory Visit: Payer: Self-pay | Admitting: Family Medicine

## 2021-11-21 DIAGNOSIS — I10 Essential (primary) hypertension: Secondary | ICD-10-CM

## 2021-11-22 IMAGING — US US RENAL
1 series · 14 of 25 positions shown · non-contrast
Comparison: CT 10/31/2004

CLINICAL DATA: Microhematuria

EXAM:
RENAL / URINARY TRACT ULTRASOUND COMPLETE

[Series 1: us renal · 0.30mm/px · 14 of 36 slices shown]
[im 1/36]
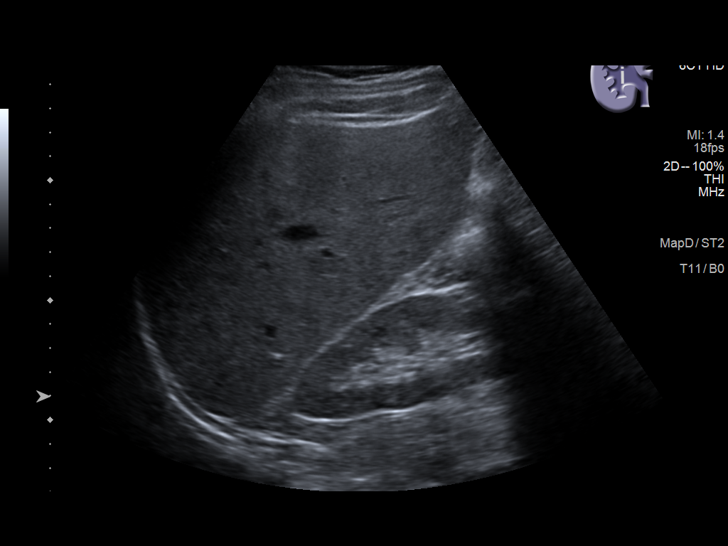
[im 3/36]
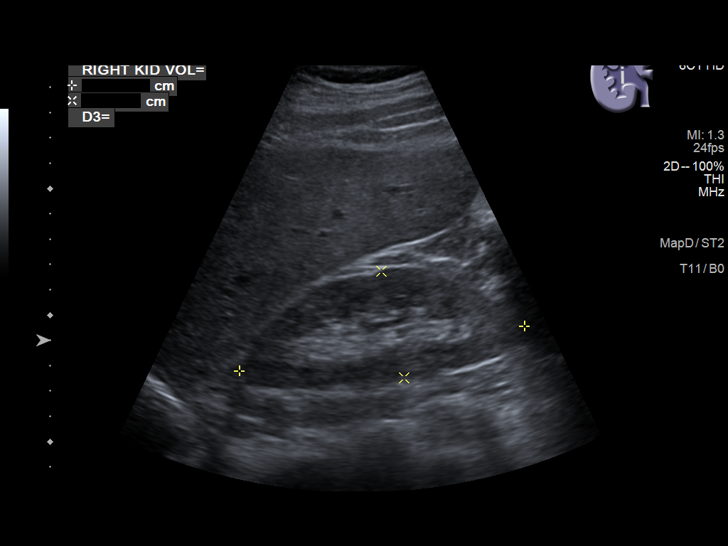
[im 6/36]
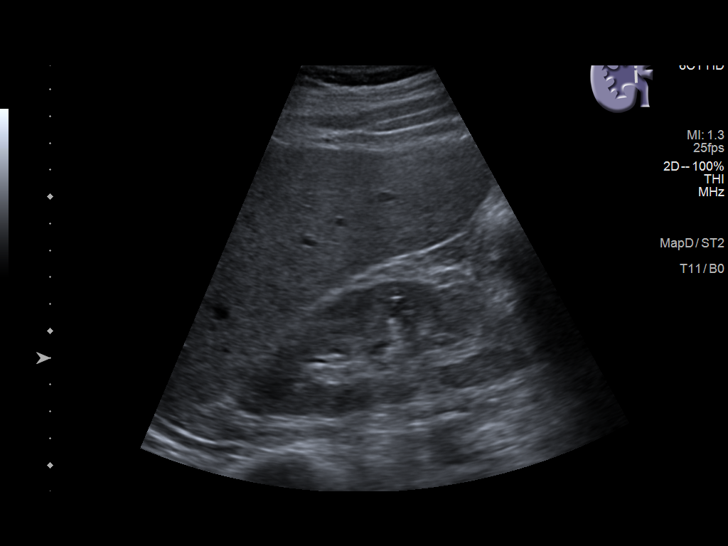
[im 9/36]
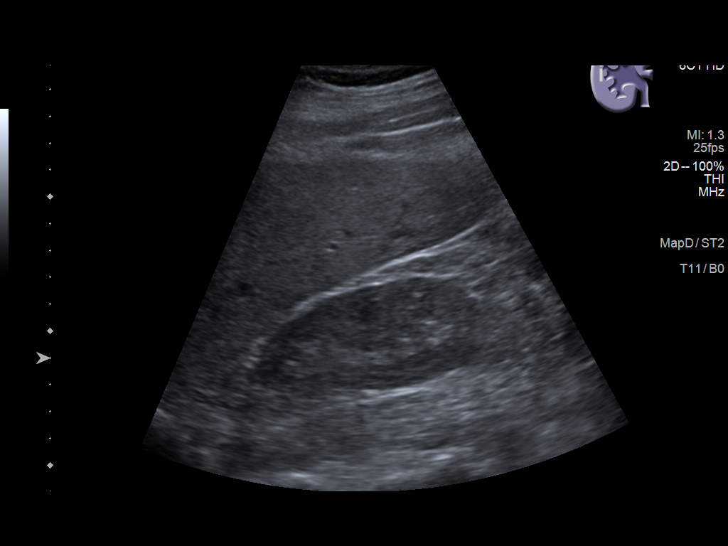
[im 12/36]
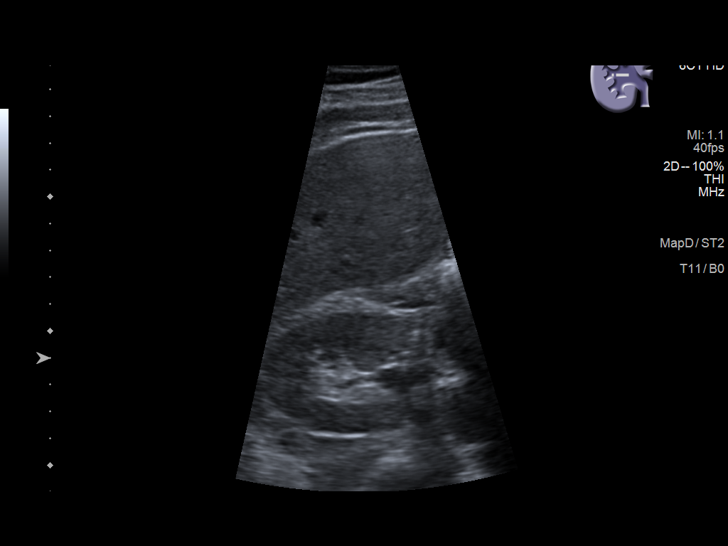
[im 14/36]
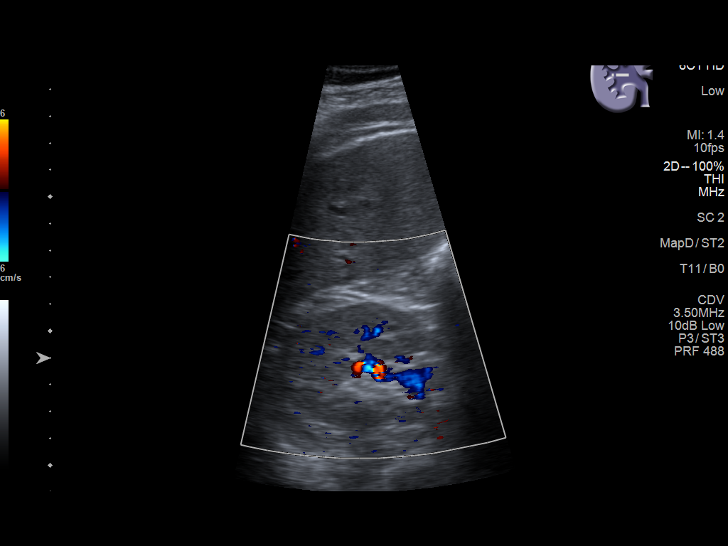
[im 17/36]
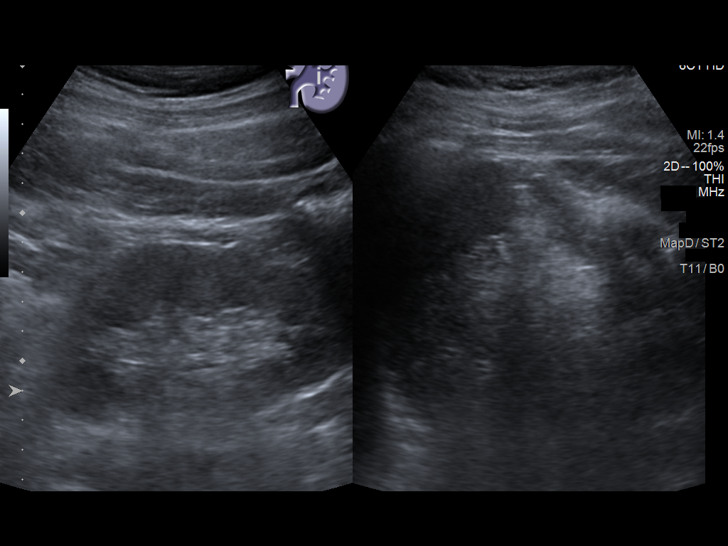
[im 19/36]
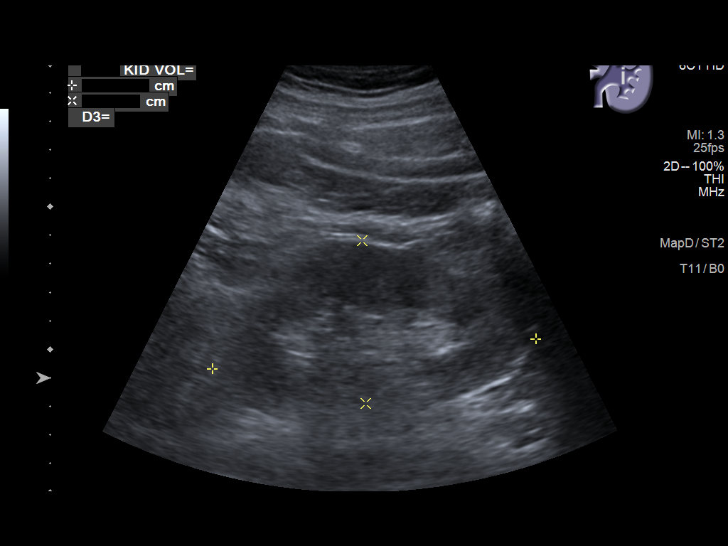
[im 22/36]
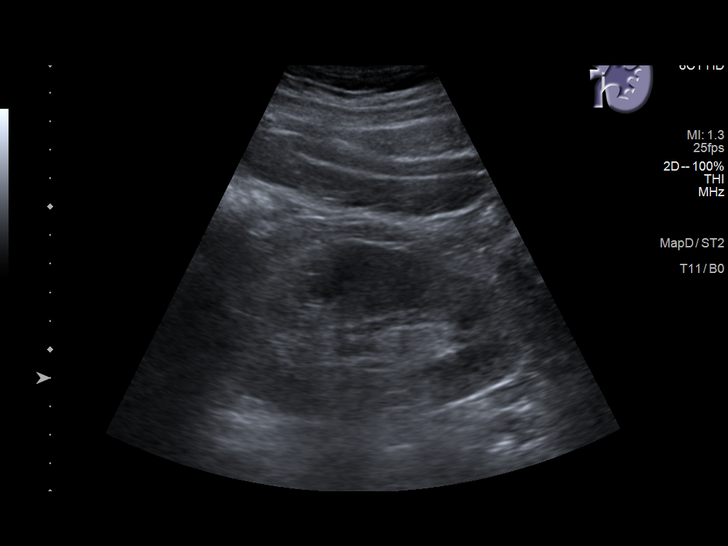
[im 24/36]
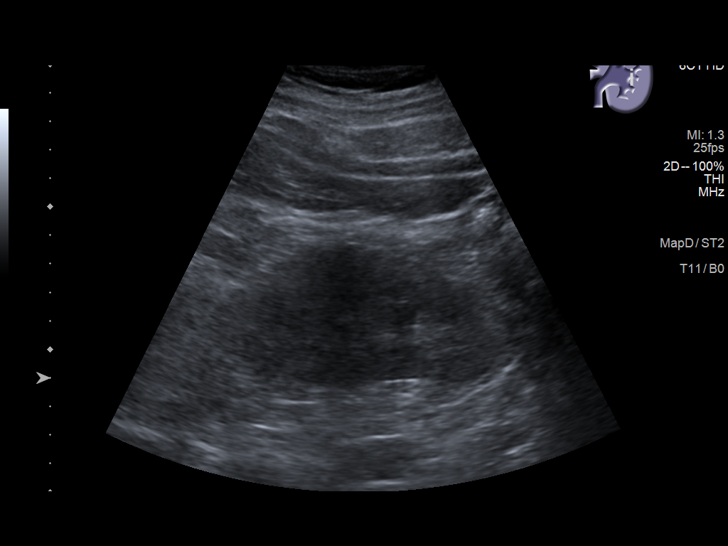
[im 27/36]
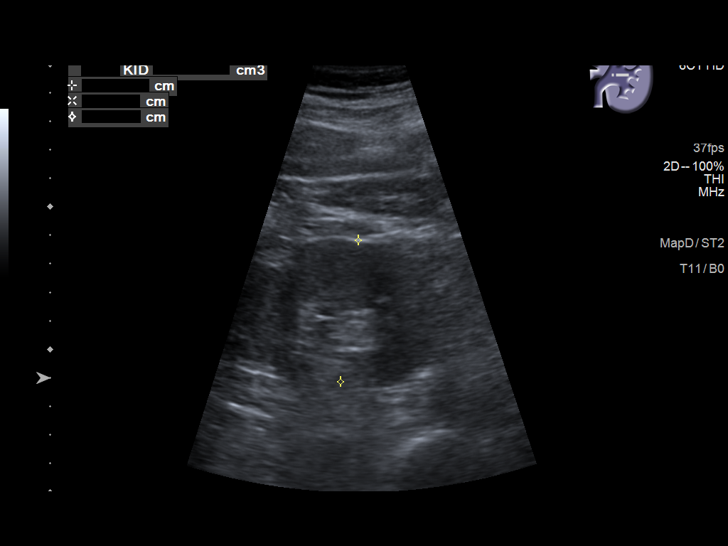
[im 30/36]
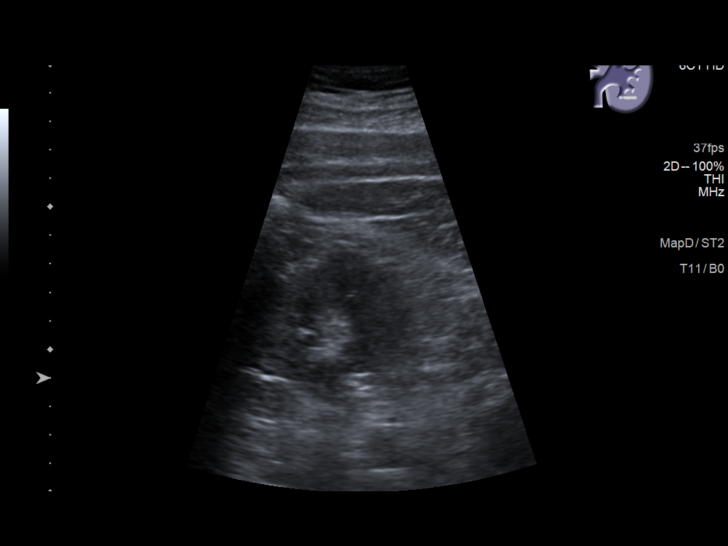
[im 33/36]
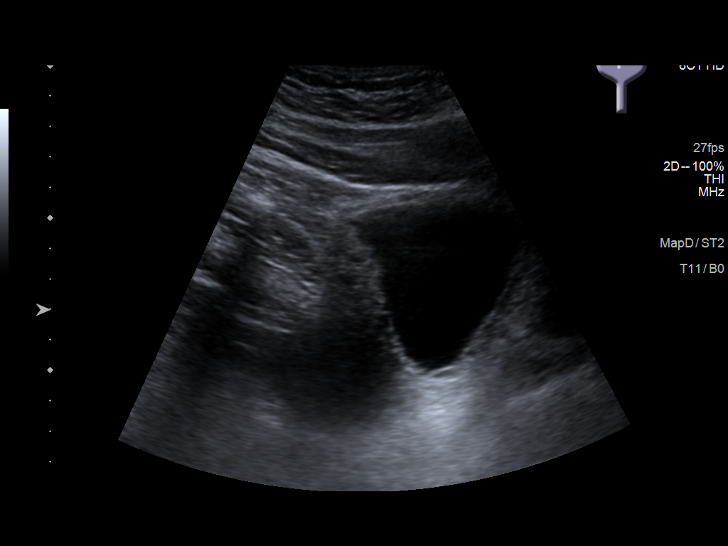
[im 36/36]
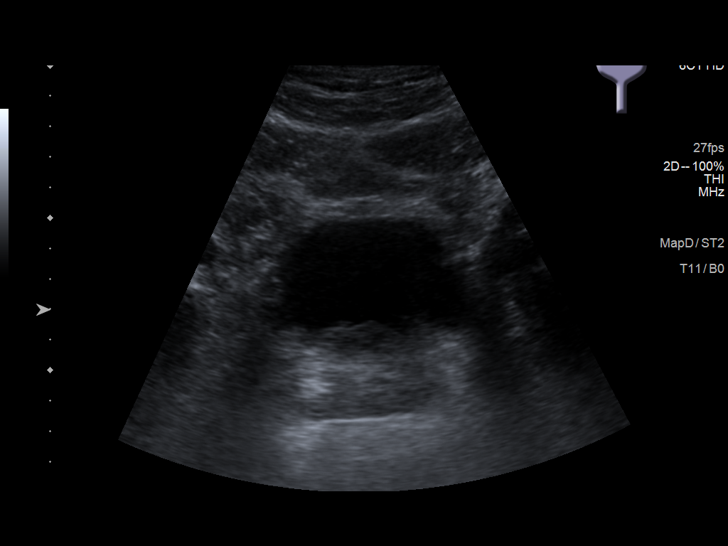

[14 of 25 positions shown; findings below may reference images not displayed]

FINDINGS: Right Kidney:

Renal measurements: 11.4 x 4.3 x 5.5 cm = volume: 142 mL. Renal
cortical echogenicity is within normal limits. Single punctate 2 mm,
shadowing echogenic focus is seen in the interpolar left kidney
(7/39). No obstructive shadowing calculus, hydronephrosis or
concerning renal mass.

Left Kidney:

Renal measurements: 11.4 x 5.7 x 5.0 cm = volume: 170.1 mL. Left
renal echogenicity may be within normal limits or perhaps slightly
increased. No obstructing urolithiasis or hydronephrosis. No
concerning renal mass.

Bladder:

Appears normal for degree of bladder distention.

Other:

None.
IMPRESSION: Upper limits normal to slightly increased left renal echogenicity.
Could reflect medical renal disease, correlate with renal function.

Punctate 2 mm nonobstructing calculus in the interpolar right
kidney. No obstructive uropathy or other acute or worrisome renal or
bladder abnormalities.

## 2021-11-24 ENCOUNTER — Encounter: Payer: Self-pay | Admitting: Family Medicine

## 2021-11-24 ENCOUNTER — Other Ambulatory Visit: Payer: Self-pay | Admitting: Family Medicine

## 2021-11-24 DIAGNOSIS — I10 Essential (primary) hypertension: Secondary | ICD-10-CM

## 2021-11-24 MED ORDER — AMLODIPINE BESYLATE 10 MG PO TABS: 10.0000 mg | ORAL_TABLET | Freq: Every day | ORAL | 0 refills | Status: DC

## 2021-12-11 ENCOUNTER — Other Ambulatory Visit: Payer: Self-pay | Admitting: Family Medicine

## 2021-12-11 DIAGNOSIS — I10 Essential (primary) hypertension: Secondary | ICD-10-CM

## 2021-12-11 NOTE — Telephone Encounter (Signed)
Schedule appointment?

## 2021-12-14 ENCOUNTER — Other Ambulatory Visit: Payer: Self-pay | Admitting: Family Medicine

## 2021-12-14 DIAGNOSIS — Z9189 Other specified personal risk factors, not elsewhere classified: Secondary | ICD-10-CM

## 2021-12-14 DIAGNOSIS — I10 Essential (primary) hypertension: Secondary | ICD-10-CM

## 2021-12-17 ENCOUNTER — Other Ambulatory Visit: Payer: Self-pay | Admitting: *Deleted

## 2021-12-17 ENCOUNTER — Other Ambulatory Visit: Payer: Self-pay | Admitting: Family Medicine

## 2021-12-17 DIAGNOSIS — I10 Essential (primary) hypertension: Secondary | ICD-10-CM

## 2021-12-17 DIAGNOSIS — Z9189 Other specified personal risk factors, not elsewhere classified: Secondary | ICD-10-CM

## 2021-12-17 MED ORDER — METOPROLOL SUCCINATE ER 50 MG PO TB24: 50.0000 mg | ORAL_TABLET | Freq: Every day | ORAL | 0 refills | Status: DC

## 2021-12-17 MED ORDER — AMLODIPINE BESYLATE 10 MG PO TABS: 10.0000 mg | ORAL_TABLET | Freq: Every day | ORAL | 0 refills | Status: DC

## 2021-12-17 MED ORDER — ATORVASTATIN CALCIUM 40 MG PO TABS: ORAL_TABLET | ORAL | 0 refills | Status: DC

## 2022-01-13 ENCOUNTER — Other Ambulatory Visit: Payer: Self-pay | Admitting: Family Medicine

## 2022-01-13 DIAGNOSIS — I10 Essential (primary) hypertension: Secondary | ICD-10-CM

## 2022-01-13 DIAGNOSIS — Z9189 Other specified personal risk factors, not elsewhere classified: Secondary | ICD-10-CM

## 2022-01-15 ENCOUNTER — Other Ambulatory Visit: Payer: Self-pay | Admitting: Family Medicine

## 2022-01-15 DIAGNOSIS — I10 Essential (primary) hypertension: Secondary | ICD-10-CM

## 2022-01-15 DIAGNOSIS — Z9189 Other specified personal risk factors, not elsewhere classified: Secondary | ICD-10-CM

## 2022-01-15 NOTE — Telephone Encounter (Signed)
Pt. Has appointment. Requested Prescriptions  Pending Prescriptions Disp Refills  . metoprolol succinate (TOPROL-XL) 50 MG 24 hr tablet [Pharmacy Med Name: METOPROLOL ER SUCCINATE '50MG'$  TABS] 30 tablet 1    Sig: TAKE 1 TABLET(50 MG) BY MOUTH DAILY WITH OR IMMEDIATELY FOLLOWING A MEAL     Cardiovascular:  Beta Blockers Failed - 01/13/2022  3:20 AM      Failed - Last BP in normal range    BP Readings from Last 1 Encounters:  07/05/21 (!) 171/96         Failed - Valid encounter within last 6 months    Recent Outpatient Visits          10 months ago Essential hypertension   Primary Care at [facility], MD   11 months ago Uncontrolled hypertension   Primary Care at [facility], MD   1 year ago Essential hypertension   Primary Care at [facility], [place], NP   1 year ago Essential hypertension   Primary Care at [facility], [place], [place]   1 year ago Annual physical exam   Primary Care at [facility], [place], [place]            In 3 weeks Dorna Mai, MD Primary Care at [facility]   In 5 months Billey Co, MD [facility]           Passed - Last Heart Rate in normal range    Pulse Readings from Last 1 Encounters:  07/05/21 87         . amLODipine (NORVASC) 10 MG tablet [Pharmacy Med Name: AMLODIPINE BESYLATE '10MG'$  TABLETS] 30 tablet 1    Sig: TAKE 1 TABLET(10 MG) BY MOUTH DAILY     Cardiovascular: Calcium Channel Blockers 2 Failed - 01/13/2022  3:20 AM      Failed - Last BP in normal range    BP Readings from Last 1 Encounters:  07/05/21 (!) 171/96         Failed - Valid encounter within last 6 months    Recent Outpatient Visits          10 months ago Essential hypertension   Primary Care at [facility], MD   11 months ago Uncontrolled hypertension   Primary Care at [facility], MD   1 year ago Essential  hypertension   Primary Care at [facility], [place], NP   1 year ago Essential hypertension   Primary Care at [facility], [place], [place]   1 year ago Annual physical exam   Primary Care at [facility], [place], [place]            In 3 weeks Dorna Mai, MD Primary Care at [facility]   In 5 months Billey Co, MD [facility]           Passed - Last Heart Rate in normal range    Pulse Readings from Last 1 Encounters:  07/05/21 87         . atorvastatin (LIPITOR) 40 MG tablet [Pharmacy Med Name: ATORVASTATIN '40MG'$  TABLETS] 30 tablet 1    Sig: TAKE 1 TABLET BY MOUTH EVERY DAY     Cardiovascular:  Antilipid - Statins Failed - 01/13/2022  3:20 AM      Failed - Lipid Panel in  normal range within the last 12 months    Cholesterol, Total  Date Value Ref Range Status  03/28/2020 90 (L) 100 - 199 mg/dL Final   LDL Chol Calc (NIH)  Date Value Ref Range Status  03/28/2020 28 0 - 99 mg/dL Final   HDL  Date Value Ref Range Status  03/28/2020 49 >39 mg/dL Final   Triglycerides  Date Value Ref Range Status  03/28/2020 50 0 - 149 mg/dL Final         Passed - Patient is not pregnant      Passed - Valid encounter within last 12 months    Recent Outpatient Visits          10 months ago Essential hypertension   Primary Care at [facility], MD   11 months ago Uncontrolled hypertension   Primary Care at [facility], MD   1 year ago Essential hypertension   Primary Care at [facility] [place], NP   1 year ago Essential hypertension   Primary Care at [facility], [place], [place]   1 year ago Annual physical exam   Primary Care at [facility], [place], [place]            In 3 weeks Dorna Mai, MD Primary Care at [facility]   In 5 months Diamantina [place], Herbert Seta, MD [place]

## 2022-02-08 DIAGNOSIS — N529 Male erectile dysfunction, unspecified: Secondary | ICD-10-CM

## 2022-02-11 ENCOUNTER — Ambulatory Visit: Payer: 59 | Admitting: Family Medicine

## 2022-02-12 MED ORDER — TADALAFIL 5 MG PO TABS: 5.0000 mg | ORAL_TABLET | Freq: Every day | ORAL | 3 refills | Status: DC | PRN

## 2022-02-13 ENCOUNTER — Other Ambulatory Visit: Payer: Self-pay | Admitting: Family Medicine

## 2022-02-13 DIAGNOSIS — I10 Essential (primary) hypertension: Secondary | ICD-10-CM

## 2022-02-13 DIAGNOSIS — Z9189 Other specified personal risk factors, not elsewhere classified: Secondary | ICD-10-CM

## 2022-03-07 ENCOUNTER — Encounter (HOSPITAL_COMMUNITY): Payer: Self-pay | Admitting: Emergency Medicine

## 2022-03-07 ENCOUNTER — Other Ambulatory Visit: Payer: Self-pay

## 2022-03-07 ENCOUNTER — Emergency Department (HOSPITAL_COMMUNITY)
Admission: EM | Admit: 2022-03-07 | Discharge: 2022-03-08 | Disposition: A | Discharge status: Home / Self Care | Payer: 59 | Attending: Emergency Medicine | Admitting: Emergency Medicine

## 2022-03-07 DIAGNOSIS — Z79899 Other long term (current) drug therapy: Secondary | ICD-10-CM | POA: Diagnosis not present

## 2022-03-07 DIAGNOSIS — I129 Hypertensive chronic kidney disease with stage 1 through stage 4 chronic kidney disease, or unspecified chronic kidney disease: Secondary | ICD-10-CM | POA: Insufficient documentation

## 2022-03-07 DIAGNOSIS — R112 Nausea with vomiting, unspecified: Secondary | ICD-10-CM | POA: Diagnosis not present

## 2022-03-07 DIAGNOSIS — Z7982 Long term (current) use of aspirin: Secondary | ICD-10-CM | POA: Diagnosis not present

## 2022-03-07 DIAGNOSIS — R42 Dizziness and giddiness: Secondary | ICD-10-CM | POA: Diagnosis present

## 2022-03-07 DIAGNOSIS — J45909 Unspecified asthma, uncomplicated: Secondary | ICD-10-CM | POA: Insufficient documentation

## 2022-03-07 DIAGNOSIS — R519 Headache, unspecified: Secondary | ICD-10-CM | POA: Insufficient documentation

## 2022-03-07 DIAGNOSIS — N189 Chronic kidney disease, unspecified: Secondary | ICD-10-CM | POA: Diagnosis not present

## 2022-03-07 LAB — CBC WITH DIFFERENTIAL/PLATELET
Abs Immature Granulocytes: 0.1 10*3/uL — ABNORMAL HIGH (ref 0.00–0.07)
Basophils Absolute: 0.1 10*3/uL (ref 0.0–0.1)
Basophils Relative: 0 %
Eosinophils Absolute: 0.1 10*3/uL (ref 0.0–0.5)
Eosinophils Relative: 1 %
HCT: 42.3 % (ref 39.0–52.0)
Hemoglobin: 14.3 g/dL (ref 13.0–17.0)
Immature Granulocytes: 1 %
Lymphocytes Relative: 12 %
Lymphs Abs: 1.6 10*3/uL (ref 0.7–4.0)
MCH: 29.3 pg (ref 26.0–34.0)
MCHC: 33.8 g/dL (ref 30.0–36.0)
MCV: 86.7 fL (ref 80.0–100.0)
Monocytes Absolute: 0.8 10*3/uL (ref 0.1–1.0)
Monocytes Relative: 6 %
Neutro Abs: 10.9 10*3/uL — ABNORMAL HIGH (ref 1.7–7.7)
Neutrophils Relative %: 80 %
Platelets: 351 10*3/uL (ref 150–400)
RBC: 4.88 MIL/uL (ref 4.22–5.81)
RDW: 13.1 % (ref 11.5–15.5)
WBC: 13.6 10*3/uL — ABNORMAL HIGH (ref 4.0–10.5)
nRBC: 0 % (ref 0.0–0.2)

## 2022-03-07 LAB — BASIC METABOLIC PANEL
Anion gap: 14 (ref 5–15)
BUN: 27 mg/dL — ABNORMAL HIGH (ref 6–20)
CO2: 28 mmol/L (ref 22–32)
Calcium: 10.1 mg/dL (ref 8.9–10.3)
Chloride: 98 mmol/L (ref 98–111)
Creatinine, Ser: 1.78 mg/dL — ABNORMAL HIGH (ref 0.61–1.24)
GFR, Estimated: 45 mL/min — ABNORMAL LOW (ref >60)
Glucose, Bld: 117 mg/dL — ABNORMAL HIGH (ref 70–99)
Potassium: 3.5 mmol/L (ref 3.5–5.1)
Sodium: 140 mmol/L (ref 135–145)

## 2022-03-07 MED ORDER — MECLIZINE HCL 25 MG PO TABS
25.0000 mg | ORAL_TABLET | Freq: Once | ORAL | Status: AC
  Administered 2022-03-07: 25 mg via ORAL
  Filled 2022-03-07: qty 1

## 2022-03-07 NOTE — ED Provider Triage Note (Signed)
Emergency Medicine Provider Triage Evaluation Note  Roberto Henderson , a 51 y.o. male  was evaluated in triage.  Pt complains of acute onset dizziness at 1400 today. Symptoms have been constant and associated with a frontal headache, nausea, vomiting. No medications PTA. No fevers, extremity numbness or paresthesias. Has a hx of HTN, HLD. Nonsmoker. No prior hx of dizziness or recent head injury/trauma.  Review of Systems  Positive: As above Negative: As above  Physical Exam  There were no vitals taken for this visit. Gen:   Awake, no distress   Resp:  Normal effort  MSK:   Moves extremities without difficulty  Other:  GCS 15. Speech is goal oriented. No deficits appreciated to CN III-XII; symmetric eyebrow raise, no facial drooping, tongue midline. Patient has equal grip strength bilaterally with 5/5 strength against resistance in all major muscle groups bilaterally. Sensation to light touch intact. Gait not assessed in triage.  Medical Decision Making  Medically screening exam initiated at 10:49 PM.  Appropriate orders placed.  [name] was informed that the remainder of the evaluation will be completed by another provider, this initial triage assessment does not replace that evaluation, and the importance of remaining in the ED until their evaluation is complete.  Acute onset vertigo - pending CT, basic labs. Will give Meclizine for symptoms pending rooming in the department.   Antonietta Breach, PA-C 03/07/22 2252

## 2022-03-07 NOTE — ED Triage Notes (Signed)
Pt BIB [facility] with reports of dizziness while at home. Per EMS pts dizziness is increased when moving head side to side. Neuro exam normal per EMS.

## 2022-03-08 ENCOUNTER — Emergency Department (HOSPITAL_COMMUNITY): Payer: 59

## 2022-03-08 MED ORDER — MECLIZINE HCL 25 MG PO TABS: 25.0000 mg | ORAL_TABLET | Freq: Three times a day (TID) | ORAL | 0 refills | Status: DC | PRN

## 2022-03-08 MED ORDER — MECLIZINE HCL 25 MG PO TABS
25.0000 mg | ORAL_TABLET | Freq: Once | ORAL | Status: AC
  Administered 2022-03-08: 25 mg via ORAL
  Filled 2022-03-08: qty 1

## 2022-03-08 MED ORDER — DIPHENHYDRAMINE HCL 50 MG/ML IJ SOLN
50.0000 mg | Freq: Once | INTRAMUSCULAR | Status: AC
  Administered 2022-03-08: 50 mg via INTRAVENOUS
  Filled 2022-03-08: qty 1

## 2022-03-08 MED ORDER — SODIUM CHLORIDE 0.9 % IV BOLUS
1000.0000 mL | Freq: Once | INTRAVENOUS | Status: AC
  Administered 2022-03-08: 1000 mL via INTRAVENOUS

## 2022-03-08 MED ORDER — PROCHLORPERAZINE EDISYLATE 10 MG/2ML IJ SOLN
10.0000 mg | Freq: Once | INTRAMUSCULAR | Status: AC
  Administered 2022-03-08: 10 mg via INTRAVENOUS
  Filled 2022-03-08: qty 2

## 2022-03-08 NOTE — Discharge Instructions (Signed)
Your history and exam today did not show evidence of acute abnormality on the CT head however given your persistent dizziness, we did want to do an MRI.  You did not tolerate the MRI and did not want to get medication to help relax you so with a shared decision-making conversation you would rather go home with a prescription for meclizine and follow-up with a primary doctor and neurologist.  Please rest and stay hydrated.  If any symptoms change or worsen acutely, please return to the nearest Emergency Department.

## 2022-03-08 NOTE — ED Provider Notes (Signed)
[facility] EMERGENCY DEPARTMENT Provider Note   CSN: [id number] Arrival date & time: 03/07/22  2153     History  Chief Complaint  Patient presents with   Dizziness    Roberto Henderson is a 51 y.o. male.  The history is provided by the patient and medical records. No language interpreter was used.  Dizziness Quality:  Room spinning Severity:  Moderate Onset quality:  Gradual Duration:  1 day Timing:  Constant Progression:  Improving Chronicity:  New Relieved by:  Nothing Worsened by:  Nothing Associated symptoms: headaches, nausea and vomiting   Associated symptoms: no chest pain, no diarrhea, no palpitations, no shortness of breath, no syncope and no weakness        Home Medications Prior to Admission medications   Medication Sig Start Date End Date Taking? Authorizing Provider  alfuzosin (UROXATRAL) 10 MG 24 hr tablet Take 1 tablet (10 mg total) by mouth daily with breakfast. 07/05/21   Billey Co, MD  amLODipine (NORVASC) 10 MG tablet TAKE 1 TABLET(10 MG) BY MOUTH DAILY 02/13/22   Dorna Mai, MD  aspirin (EC-81 ASPIRIN) 81 MG EC tablet Take 1 tablet (81 mg total) by mouth daily. Swallow whole. 10/13/19   Nicolette Bang, MD  atorvastatin (LIPITOR) 40 MG tablet TAKE 1 TABLET BY MOUTH EVERY DAY 02/13/22   Dorna Mai, MD  hydrochlorothiazide (HYDRODIURIL) 25 MG tablet Take 1 tablet (25 mg total) by mouth daily. 03/05/21   Jerline Pain, MD  metoprolol succinate (TOPROL-XL) 50 MG 24 hr tablet TAKE 1 TABLET(50 MG) BY MOUTH DAILY WITH OR IMMEDIATELY FOLLOWING A MEAL 02/13/22   Dorna Mai, MD  Multiple Vitamin (MULTIVITAMIN) tablet Take 1 tablet by mouth daily.    [provider]  spironolactone (ALDACTONE) 25 MG tablet TAKE 1 TABLET(25 MG) BY MOUTH DAILY 05/21/21   Dorna Mai, MD  tadalafil (CIALIS) 5 MG tablet Take 1 tablet (5 mg total) by mouth daily as needed for erectile dysfunction. 02/12/22   Billey Co, MD   telmisartan (MICARDIS) 80 MG tablet Take 1 tablet (80 mg total) by mouth daily. 03/05/21   Jerline Pain, MD      Allergies    Patient has no known allergies.    Review of Systems   Review of Systems  Constitutional:  Negative for chills, fatigue and fever.  HENT:  Negative for congestion.   Eyes:  Negative for visual disturbance.  Respiratory:  Negative for cough, chest tightness and shortness of breath.   Cardiovascular:  Negative for chest pain, palpitations and syncope.  Gastrointestinal:  Positive for nausea and vomiting. Negative for abdominal pain and diarrhea.  Genitourinary:  Negative for dysuria and flank pain.  Musculoskeletal:  Negative for back pain and neck pain.  Skin:  Negative for rash and wound.  Neurological:  Positive for dizziness, light-headedness and headaches. Negative for weakness and numbness.  Psychiatric/Behavioral:  Negative for agitation and confusion.     Physical Exam Updated Vital Signs BP 133/80   Pulse 85   Temp 99.3 F (37.4 C)   Resp 16   SpO2 99%  Physical Exam Vitals and nursing note reviewed.  Constitutional:      General: He is not in acute distress.    Appearance: He is well-developed. He is not ill-appearing, toxic-appearing or diaphoretic.  HENT:     Head: Normocephalic and atraumatic.     Nose: Nose normal. No congestion or rhinorrhea.     Mouth/Throat:  Mouth: Mucous membranes are moist.     Pharynx: No oropharyngeal exudate or posterior oropharyngeal erythema.  Eyes:     Extraocular Movements: Extraocular movements intact.     Conjunctiva/sclera: Conjunctivae normal.     Pupils: Pupils are equal, round, and reactive to light.  Cardiovascular:     Rate and Rhythm: Normal rate and regular rhythm.     Heart sounds: No murmur heard. Pulmonary:     Effort: Pulmonary effort is normal. No respiratory distress.     Breath sounds: Normal breath sounds. No wheezing, rhonchi or rales.  Chest:     Chest wall: No tenderness.   Abdominal:     Palpations: Abdomen is soft.     Tenderness: There is no abdominal tenderness. There is no guarding or rebound.  Musculoskeletal:        General: No swelling or tenderness.     Cervical back: Neck supple. No tenderness.     Right lower leg: No edema.     Left lower leg: No edema.  Skin:    General: Skin is warm and dry.     Capillary Refill: Capillary refill takes less than 2 seconds.     Findings: No erythema or rash.  Neurological:     General: No focal deficit present.     Mental Status: He is alert.  Psychiatric:        Mood and Affect: Mood normal.     ED Results / Procedures / Treatments   Labs (all labs ordered are listed, but only abnormal results are displayed) Labs Reviewed  CBC WITH DIFFERENTIAL/PLATELET - Abnormal; Notable for the following components:      Result Value   WBC 13.6 (*)    Neutro Abs 10.9 (*)    Abs Immature Granulocytes 0.10 (*)    All other components within normal limits  BASIC METABOLIC PANEL - Abnormal; Notable for the following components:   Glucose, Bld 117 (*)    BUN 27 (*)    Creatinine, Ser 1.78 (*)    GFR, Estimated 45 (*)    All other components within normal limits    EKG None  Radiology CT HEAD WO CONTRAST (5MM)  Result Date: 03/08/2022 CLINICAL DATA:  Nonspecific dizziness. Acute onset dizziness 1400 hours today. Frontal headache, nausea, and vomiting. EXAM: CT HEAD WITHOUT CONTRAST TECHNIQUE: Contiguous axial images were obtained from the base of the skull through the vertex without intravenous contrast. RADIATION DOSE REDUCTION: This exam was performed according to the departmental dose-optimization program which includes automated exposure control, adjustment of the mA and/or kV according to patient size and/or use of iterative reconstruction technique. COMPARISON:  12/21/2005 FINDINGS: Brain: No evidence of acute infarction, hemorrhage, hydrocephalus, extra-axial collection or mass lesion/mass effect.  Vascular: No hyperdense vessel or unexpected calcification. Skull: Normal. Negative for fracture or focal lesion. Sinuses/Orbits: Mucosal thickening in the paranasal sinuses. No acute air-fluid levels. Mastoid air cells are clear. Other: None. IMPRESSION: No acute intracranial abnormalities. Electronically Signed   By: Lucienne Capers M.D.   On: 03/08/2022 00:23    Procedures Procedures    Medications Ordered in ED Medications  meclizine (ANTIVERT) tablet 25 mg (25 mg Oral Given 03/07/22 2317)  meclizine (ANTIVERT) tablet 25 mg (25 mg Oral Given 03/08/22 1110)  sodium chloride 0.9 % bolus 1,000 mL (0 mLs Intravenous Stopped 03/08/22 2129)  diphenhydrAMINE (BENADRYL) injection 50 mg (50 mg Intravenous Given 03/08/22 1918)  prochlorperazine (COMPAZINE) injection 10 mg (10 mg Intravenous Given 03/08/22 1919)  ED Course/ Medical Decision Making/ A&P                           Medical Decision Making Amount and/or Complexity of Data Reviewed Radiology: ordered.  Risk Prescription drug management.    RAS KOLLMAN is a 51 y.o. male with a past medical history significant for hypertension, sleep apnea, asthma, BPH, and CKD who presents with dizziness.  According to patient, yesterday patient reports having onset of dizziness and strange sensation starting at 2 PM.  He reports some mild frontal headache but otherwise denies fall or trauma.  He reports that he felt like he was leaning to the left and that he was very unsteady.  He felt it was worsened with positioning of his head but he has no personal history of vertigo.  Denies any fevers, chills, congestion, cough.  Denies any neck stiffness or neck pain.  He reports some nausea and vomiting and felt like his vision was abnormal bilaterally.  He reports that that has improved but he is still having the dizziness.  He denies any constipation, diarrhea, or urinary changes.  Denies any preceding symptoms yesterday.   Of note, patient has been  here for approximately 20 hours and 30 minutes prior to my initial evaluation.  He is still feeling unsteady and that his balance is off.  He worsened when he sat up.  He does feel like his mouth is dry and he has not had much to eat or drink in the last 20 hours being here.  He denies any personal history of stroke and denies any history of vertigo.  On my exam, he had intact sensation, strength, and pulses in extremities.  Normal finger-nose-finger testing.  Symmetric smile.  Clear speech.  Pupils are symmetric and reactive with normal extraocular movements.  No carotid bruit.  When he sat up he did feel slightly more dizzy and unsteady.  He did feel he was leaning to the left.  No speech abnormalities on my exam.  Exam otherwise unremarkable with nontender chest, abdomen, or back.  Lungs clear.  Patient had a CT head in triage that was unremarkable.  Given his persistent symptoms despite 2 doses of meclizine, we will order MRI to rule out acute stroke.  We will also give a headache cocktail with some fluids for the discomfort and the suspected mild dehydration with his dry mucous membranes.  MRI was ordered and after several hours patient went down to receive it but then had claustrophobia.  After he returned, he says he is now with the MRI and would rather go home with meclizine prescription.  He will follow-up with outpatient PCP and neurology.  Patient reports he is feeling better and will be discharged.  Patient discharged in stable condition after not wanting the MRI.         Final Clinical Impression(s) / ED Diagnoses Final diagnoses:  Dizziness    Rx / DC Orders ED Discharge Orders          Ordered    meclizine (ANTIVERT) 25 MG tablet  3 times daily PRN        03/08/22 2258            Clinical Impression: 1. Dizziness     Disposition: Discharge  Condition: Good  I have discussed the results, Dx and Tx plan with the pt(& family if present). He/she/they expressed  understanding and agree(s) with the plan. Discharge instructions discussed  at great length. Strict return precautions discussed and pt &/or family have verbalized understanding of the instructions. No further questions at time of discharge.    New Prescriptions   MECLIZINE (ANTIVERT) 25 MG TABLET    Take 1 tablet (25 mg total) by mouth 3 (three) times daily as needed for dizziness.    Follow Up: Dorna Mai, [address] [place] [address] [phone]     [facility] [address]     [address] [id number] [place] EMERGENCY DEPARTMENT [address] [id number] mc [place] [phone]        Kieran Arreguin, Gwenyth Allegra, MD 03/08/22 [phone]

## 2022-03-08 NOTE — ED Notes (Signed)
Pt. Reports having dizziness , room spinning.   Denies any pain or discomfort.,

## 2022-03-08 NOTE — ED Notes (Signed)
Transported to MRI

## 2022-03-15 ENCOUNTER — Other Ambulatory Visit: Payer: Self-pay

## 2022-03-15 MED ORDER — HYDROCHLOROTHIAZIDE 25 MG PO TABS: 25.0000 mg | ORAL_TABLET | Freq: Every day | ORAL | 0 refills | Status: DC

## 2022-03-15 MED ORDER — TELMISARTAN 80 MG PO TABS: 80.0000 mg | ORAL_TABLET | Freq: Every day | ORAL | 0 refills | Status: DC

## 2022-03-15 NOTE — Addendum Note (Signed)
Addended by: Carter Kitten D on: 03/15/2022 08:03 AM   Modules accepted: Orders

## 2022-03-18 ENCOUNTER — Encounter: Payer: Self-pay | Admitting: Family Medicine

## 2022-03-18 ENCOUNTER — Ambulatory Visit (INDEPENDENT_AMBULATORY_CARE_PROVIDER_SITE_OTHER): Payer: 59 | Admitting: Family Medicine

## 2022-03-18 VITALS — BP 136/82 | HR 92 | Temp 98.1°F | Resp 16 | Ht 68.0 in | Wt 248.4 lb

## 2022-03-18 DIAGNOSIS — Z23 Encounter for immunization: Secondary | ICD-10-CM

## 2022-03-18 DIAGNOSIS — R42 Dizziness and giddiness: Secondary | ICD-10-CM | POA: Diagnosis not present

## 2022-03-18 DIAGNOSIS — J329 Chronic sinusitis, unspecified: Secondary | ICD-10-CM | POA: Diagnosis not present

## 2022-03-18 DIAGNOSIS — I10 Essential (primary) hypertension: Secondary | ICD-10-CM

## 2022-03-18 MED ORDER — AMOXICILLIN 875 MG PO TABS: 875.0000 mg | ORAL_TABLET | Freq: Two times a day (BID) | ORAL | 0 refills | Status: AC

## 2022-03-18 MED ORDER — MECLIZINE HCL 25 MG PO TABS: 25.0000 mg | ORAL_TABLET | Freq: Three times a day (TID) | ORAL | 0 refills | Status: DC | PRN

## 2022-03-18 MED ORDER — SPIRONOLACTONE 25 MG PO TABS: ORAL_TABLET | ORAL | 1 refills | Status: DC

## 2022-03-20 ENCOUNTER — Encounter: Payer: Self-pay | Admitting: Family Medicine

## 2022-03-20 NOTE — Progress Notes (Signed)
Established Patient Office Visit  Subjective    Patient ID: Roberto Henderson, male    DOB: 1970-10-20  Age: 51 y.o. MRN: [id number]  CC:  Chief Complaint  Patient presents with   Follow-up    HFU    HPI THAYNE CINDRIC presents for routine follow up of hypertension. Patient also reports facial pain and congestion with some intermittent dizziness. Patient denies fever/chills. Patient was seen in ED for sx.    Outpatient Encounter Medications as of 03/18/2022  Medication Sig   acetaminophen (TYLENOL) 500 MG tablet Take 1,000 mg by mouth every 6 (six) hours as needed.   alfuzosin (UROXATRAL) 10 MG 24 hr tablet Take 1 tablet (10 mg total) by mouth daily with breakfast.   amLODipine (NORVASC) 10 MG tablet TAKE 1 TABLET(10 MG) BY MOUTH DAILY (Patient taking differently: Take 10 mg by mouth daily.)   amoxicillin (AMOXIL) 875 MG tablet Take 1 tablet (875 mg total) by mouth 2 (two) times daily for 10 days.   aspirin (EC-81 ASPIRIN) 81 MG EC tablet Take 1 tablet (81 mg total) by mouth daily. Swallow whole.   atorvastatin (LIPITOR) 40 MG tablet TAKE 1 TABLET BY MOUTH EVERY DAY (Patient taking differently: Take 40 mg by mouth daily. TAKE 1 TABLET BY MOUTH EVERY DAY)   hydrochlorothiazide (HYDRODIURIL) 25 MG tablet Take 1 tablet (25 mg total) by mouth daily.   metoprolol succinate (TOPROL-XL) 50 MG 24 hr tablet TAKE 1 TABLET(50 MG) BY MOUTH DAILY WITH OR IMMEDIATELY FOLLOWING A MEAL (Patient taking differently: Take 50 mg by mouth daily.)   Multiple Vitamin (MULTIVITAMIN) tablet Take 1 tablet by mouth daily.   Multiple Vitamins-Minerals (CENTRUM ADULT PO) Take 1 tablet by mouth daily.   sildenafil (VIAGRA) 100 MG tablet Take 50-100 mg by mouth daily as needed for erectile dysfunction.   tadalafil (CIALIS) 5 MG tablet Take 1 tablet (5 mg total) by mouth daily as needed for erectile dysfunction.   telmisartan (MICARDIS) 80 MG tablet Take 1 tablet (80 mg total) by mouth daily.   [DISCONTINUED]  meclizine (ANTIVERT) 25 MG tablet Take 1 tablet (25 mg total) by mouth 3 (three) times daily as needed for dizziness.   [DISCONTINUED] spironolactone (ALDACTONE) 25 MG tablet TAKE 1 TABLET(25 MG) BY MOUTH DAILY (Patient taking differently: Take 25 mg by mouth once.)   meclizine (ANTIVERT) 25 MG tablet Take 1 tablet (25 mg total) by mouth 3 (three) times daily as needed for dizziness.   spironolactone (ALDACTONE) 25 MG tablet TAKE 1 TABLET(25 MG) BY MOUTH DAILY   No facility-administered encounter medications on file as of 03/18/2022.    Past Medical History:  Diagnosis Date   Asthma    as a teenager   Hypertension     Past Surgical History:  Procedure Laterality Date   NO PAST SURGERIES      Family History  Problem Relation Age of Onset   Colon polyps Mother    Hypertension Father    Colon cancer Neg Hx    Esophageal cancer Neg Hx    Rectal cancer Neg Hx    Stomach cancer Neg Hx     Social History   Socioeconomic History   Marital status: Married    Spouse name: Not on file   Number of children: Not on file   Years of education: Not on file   Highest education level: Not on file  Occupational History   Not on file  Tobacco Use   Smoking status: Never  Smokeless tobacco: Never  Vaping Use   Vaping Use: Never used  Substance and Sexual Activity   Alcohol use: Yes    Comment: social drinker   Drug use: Not Currently   Sexual activity: Yes    Birth control/protection: None  Other Topics Concern   Not on file  Social History Narrative   Not on file   Social Determinants of Health   Financial Resource Strain: Not on file  Food Insecurity: Not on file  Transportation Needs: Not on file  Physical Activity: Not on file  Stress: Not on file  Social Connections: Not on file  Intimate Partner Violence: Not on file    Review of Systems  Constitutional:  Negative for chills and fever.  HENT:  Positive for congestion and sinus pain. Negative for ear pain.    Neurological:  Positive for dizziness.  All other systems reviewed and are negative.       Objective    BP 136/82   Pulse 92   Temp 98.1 F (36.7 C) (Oral)   Resp 16   Ht '5\' 8"'$  (1.727 m)   Wt 248 lb 6.4 oz (112.7 kg)   SpO2 95%   BMI 37.77 kg/m   Physical Exam Vitals and nursing note reviewed.  Constitutional:      General: He is not in acute distress. HENT:     Head: Normocephalic and atraumatic.     Right Ear: Tympanic membrane normal.     Left Ear: Tympanic membrane normal.     Nose:     Right Sinus: No maxillary sinus tenderness or frontal sinus tenderness.     Left Sinus: Maxillary sinus tenderness and frontal sinus tenderness present.  Cardiovascular:     Rate and Rhythm: Normal rate and regular rhythm.  Pulmonary:     Effort: Pulmonary effort is normal.     Breath sounds: Normal breath sounds.  Abdominal:     Palpations: Abdomen is soft.     Tenderness: There is no abdominal tenderness.  Neurological:     General: No focal deficit present.     Mental Status: He is alert and oriented to person, place, and time.         Assessment & Plan:   1. Essential hypertension Appears stable. Continue   2. Chronic sinusitis, unspecified location Amox prescribed  3. Dizziness and giddiness ? 2/2 above. Meclizine prescribed  4. Needs flu shot   5. Need for immunization against influenza  - Flu Vaccine QUAD 65moIM (Fluarix, Fluzone & Alfiuria Quad PF)    No follow-ups on file.   WBecky Sax MD

## 2022-04-14 ENCOUNTER — Other Ambulatory Visit: Payer: Self-pay | Admitting: Cardiology

## 2022-04-15 ENCOUNTER — Other Ambulatory Visit: Payer: Self-pay

## 2022-04-15 ENCOUNTER — Ambulatory Visit (INDEPENDENT_AMBULATORY_CARE_PROVIDER_SITE_OTHER): Payer: 59

## 2022-04-15 DIAGNOSIS — Z23 Encounter for immunization: Secondary | ICD-10-CM

## 2022-04-15 MED ORDER — HYDROCHLOROTHIAZIDE 25 MG PO TABS: 25.0000 mg | ORAL_TABLET | Freq: Every day | ORAL | 0 refills | Status: DC

## 2022-04-15 MED ORDER — TELMISARTAN 80 MG PO TABS: 80.0000 mg | ORAL_TABLET | Freq: Every day | ORAL | 0 refills | Status: DC

## 2022-04-15 NOTE — Progress Notes (Signed)
Patient came  in today for Shringx vaccine today. Given and tolerated well

## 2022-04-16 ENCOUNTER — Ambulatory Visit: Payer: 59 | Admitting: Family Medicine

## 2022-05-08 ENCOUNTER — Encounter: Payer: Self-pay | Admitting: Urology

## 2022-05-09 ENCOUNTER — Ambulatory Visit: Payer: Self-pay | Admitting: *Deleted

## 2022-05-09 NOTE — Telephone Encounter (Signed)
Summary: Dizziness Advice   Pt is calling to schedule a hospital follow up for vertigo. Pt does report feeling dizzy at this moment. Pt did schedule appt for January 24th. Wanting sooner appt. Amy Minette Brine has appts on January 4th. Please advise          Chief Complaint: dizziness, vertigo  Symptoms: room spinning when moves too fast. Has been seen in ED for dizziness. Taking meclizine with some relief. Denies dizziness now no weakness or numbness of extremities now. Has reported numbness in right leg and elbow before today. Dizziness if affecting work. Change in head position too fast causes worsening dizziness Frequency: 2 months approx Pertinent Negatives: Patient denies chest pain no difficulty breathing no weakness on either side of body. No elevated BP. No vomiting.  Disposition: '[]'$ ED /'[]'$ Urgent Care (no appt availability in office) / '[x]'$ Appointment(In office/virtual)/ '[]'$  [facility] [address] [facility]/ '[]'$ Home Care/ '[]'$ Refused Recommended Disposition /'[]'$ [facility]/ '[]'$  Follow-up with PCP Additional Notes:   Recommended Mobile [facility] , appt scheduled for 1/ 5/24. Please advise if patient can be seen earlier. On waitlist. Needs paper work for work due to has to work from home.      Reason for Disposition  [1] MODERATE dizziness (e.g., vertigo; feels very unsteady, interferes with normal activities) AND [2] has been evaluated by doctor (or NP/PA) for this  Answer Assessment - Initial Assessment Questions 1. DESCRIPTION: "Describe your dizziness."     Room spinning  2. VERTIGO: "Do you feel like either you or the room is spinning or tilting?"      Room spinning  3. LIGHTHEADED: "Do you feel lightheaded?" (e.g., somewhat faint, woozy, weak upon standing)     No . Reports vertigo with any fast movements  4. SEVERITY: "How bad is it?"  "Can you walk?"   - MILD: Feels slightly dizzy and unsteady, but is walking normally.   - MODERATE: Feels unsteady when walking, but not falling;  interferes with normal activities (e.g., school, work).   - SEVERE: Unable to walk without falling, or requires assistance to walk without falling.     Moderate  interferes with work  5. ONSET:  "When did the dizziness begin?"     Approx 2 months ago  6. AGGRAVATING FACTORS: "Does anything make it worse?" (e.g., standing, change in head position)     Change in head position fast  7. CAUSE: "What do you think is causing the dizziness?"     Not sure sinus issues possible in the beginning  8. RECURRENT SYMPTOM: "Have you had dizziness before?" If Yes, ask: "When was the last time?" "What happened that time?"     Yes started 2 months ago  9. OTHER SYMPTOMS: "Do you have any other symptoms?" (e.g., headache, weakness, numbness, vomiting, earache)     Numbness right leg and elbow before but not now. Vomiting before but now in a "while". Now feels room spinning if moves too fast  10. PREGNANCY: "Is there any chance you are pregnant?" "When was your last menstrual period?" na  Protocols used: Dizziness - Vertigo-A-AH

## 2022-05-10 NOTE — Telephone Encounter (Signed)
Noted  

## 2022-05-14 NOTE — Progress Notes (Signed)
Patient ID: Roberto Henderson, male    DOB: 09/13/1970  MRN: [id number]  CC: Dizziness   Subjective: Roberto Henderson is a 52 y.o. male who presents for dizziness.   His concerns today include:  05/09/2022 per triage RN note: Summary: Dizziness Advice     Pt is calling to schedule a hospital follow up for vertigo. Pt does report feeling dizzy at this moment. Pt did schedule appt for January 24th. Wanting sooner appt. Roberto Henderson Roberto Henderson has appts on January 4th. Please advise          Chief Complaint: dizziness, vertigo  Symptoms: room spinning when moves too fast. Has been seen in ED for dizziness. Taking meclizine with some relief. Denies dizziness now no weakness or numbness of extremities now. Has reported numbness in right leg and elbow before today. Dizziness if affecting work. Change in head position too fast causes worsening dizziness Frequency: 2 months approx Pertinent Negatives: Patient denies chest pain no difficulty breathing no weakness on either side of body. No elevated BP. No vomiting.  Disposition: '[]'$ ED /'[]'$ Urgent Care (no appt availability in office) / '[x]'$ Appointment(In office/virtual)/ '[]'$  [facility]/ '[]'$ Home Care/ '[]'$ Refused Recommended Disposition /'[]'$ [facility]/ '[]'$  Follow-up with PCP Additional Notes:    Recommended Mobile bus , appt scheduled for 1/ 5/24. Please advise if patient can be seen earlier. On waitlist. Needs paper work for work due to has to work from home.     Reason for Disposition  [1] MODERATE dizziness (e.g., vertigo; feels very unsteady, interferes with normal activities) AND [2] has been evaluated by doctor (or NP/PA) for this  Answer Assessment - Initial Assessment Questions 1. DESCRIPTION: "Describe your dizziness."     Room spinning  2. VERTIGO: "Do you feel like either you or the room is spinning or tilting?"      Room spinning  3. LIGHTHEADED: "Do you feel lightheaded?" (e.g., somewhat faint, woozy, weak upon standing)     No  . Reports vertigo with any fast movements  4. SEVERITY: "How bad is it?"  "Can you walk?"   - MILD: Feels slightly dizzy and unsteady, but is walking normally.   - MODERATE: Feels unsteady when walking, but not falling; interferes with normal activities (e.g., school, work).   - SEVERE: Unable to walk without falling, or requires assistance to walk without falling.     Moderate  interferes with work  5. ONSET:  "When did the dizziness begin?"     Approx 2 months ago  6. AGGRAVATING FACTORS: "Does anything make it worse?" (e.g., standing, change in head position)     Change in head position fast  7. CAUSE: "What do you think is causing the dizziness?"     Not sure sinus issues possible in the beginning  8. RECURRENT SYMPTOM: "Have you had dizziness before?" If Yes, ask: "When was the last time?" "What happened that time?"     Yes started 2 months ago  9. OTHER SYMPTOMS: "Do you have any other symptoms?" (e.g., headache, weakness, numbness, vomiting, earache)     Numbness right leg and elbow before but not now. Vomiting before but now in a "while". Now feels room spinning if moves too fast  10. PREGNANCY: "Is there any chance you are pregnant?" "When was your last menstrual period?" Na  Today's visit 05/17/2022: Patient was initially seen on 03/07/2022 at [facility] Emergency Department for dizziness and per MD recommendations follow-up with [facility]. Subsequently patient was seen  03/18/2022 by his PCP Dr. Dorna Mai for the same and Meclizine prescribed. Reports at the same visit with his PCP he was told that referral to Neurology was not needed unless an MRI needed to be completed at a later time. Today patient presents for follow-up of dizziness. Reports dizziness has improved some but he is not back to his baseline. Reports Meclizine is helping some but not as much as he would like to. Reports he has been out of work since October 2023 under [employer]. Reports he is  planning to return to work soon and has disability paperwork that needs to be completed by 05/22/2022. 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    Patient Active Problem List   Diagnosis Date Noted   Chronic kidney disease, stage 3a ([place]) 03/05/2021   Heart murmur 03/05/2021   OSA (obstructive sleep apnea) 04/25/2020   Benign prostatic hyperplasia with incomplete bladder emptying 01/26/2020   Candidate for statin therapy due to risk of future cardiovascular event 10/13/2019   Essential hypertension 10/08/2019     Current Outpatient Medications on File Prior to Visit  Medication Sig Dispense Refill   acetaminophen (TYLENOL) 500 MG tablet Take 1,000 mg by mouth every 6 (six) hours as needed.     alfuzosin (UROXATRAL) 10 MG 24 hr tablet Take 1 tablet (10 mg total) by mouth daily with breakfast. 30 tablet 11   amLODipine (NORVASC) 10 MG tablet TAKE 1 TABLET(10 MG) BY MOUTH DAILY (Patient taking differently: Take 10 mg by mouth daily.) 90 tablet 0   aspirin (EC-81 ASPIRIN) 81 MG EC tablet Take 1 tablet (81 mg total) by mouth daily. Swallow whole. 30 tablet 12   atorvastatin (LIPITOR) 40 MG tablet TAKE 1 TABLET BY MOUTH EVERY DAY (Patient taking differently: Take 40 mg by mouth daily. TAKE 1 TABLET BY MOUTH EVERY DAY) 90 tablet 0   hydrochlorothiazide (HYDRODIURIL) 25 MG tablet Take 1 tablet (25 mg total) by mouth daily. 30 tablet 0   metoprolol succinate (TOPROL-XL) 50 MG 24 hr tablet TAKE 1 TABLET(50 MG) BY MOUTH DAILY WITH OR IMMEDIATELY FOLLOWING A MEAL (Patient taking differently: Take 50 mg by mouth daily.) 90 tablet 0   Multiple Vitamin (MULTIVITAMIN) tablet Take 1 tablet by mouth daily.     Multiple Vitamins-Minerals (CENTRUM ADULT PO) Take 1 tablet by mouth daily.     sildenafil (VIAGRA) 100 MG tablet Take 50-100 mg by mouth daily as needed for erectile dysfunction.     spironolactone (ALDACTONE) 25 MG tablet TAKE 1 TABLET(25 MG) BY MOUTH  DAILY 90 tablet 1   tadalafil (CIALIS) 5 MG tablet Take 1 tablet (5 mg total) by mouth daily as needed for erectile dysfunction. 90 tablet 3   telmisartan (MICARDIS) 80 MG tablet Take 1 tablet (80 mg total) by mouth daily. 30 tablet 0   No current facility-administered medications on file prior to visit.    No Known Allergies  Social History   Socioeconomic History   Marital status: Married    Spouse name: Not on file   Number of children: Not on file   Years of education: Not on file   Highest education level: Not on file  Occupational History   Not on file  Tobacco Use   Smoking status: Never   Smokeless tobacco: Never  Vaping Use   Vaping Use: Never used  Substance and Sexual Activity   Alcohol use: Yes    Comment: social drinker  Drug use: Not Currently   Sexual activity: Yes    Birth control/protection: None  Other Topics Concern   Not on file  Social History Narrative   Not on file   Social Determinants of Health   Financial Resource Strain: Not on file  Food Insecurity: Not on file  Transportation Needs: Not on file  Physical Activity: Not on file  Stress: Not on file  Social Connections: Not on file  Intimate Partner Violence: Not on file    Family History  Problem Relation Age of Onset   Colon polyps Mother    Hypertension Father    Colon cancer Neg Hx    Esophageal cancer Neg Hx    Rectal cancer Neg Hx    Stomach cancer Neg Hx     Past Surgical History:  Procedure Laterality Date   NO PAST SURGERIES      ROS: Review of Systems Negative except as stated above  PHYSICAL EXAM: BP (!) 142/81   Pulse 89   Ht 5' 8.5" (1.74 m)   Wt 251 lb 6.4 oz (114 kg)   SpO2 95%   BMI 37.67 kg/m   Physical Exam HENT:     Head: Normocephalic and atraumatic.     Right Ear: Tympanic membrane, ear canal and external ear normal.     Left Ear: Tympanic membrane, ear canal and external ear normal.     Nose: Nose normal.     Mouth/Throat:     Mouth:  Mucous membranes are moist.     Pharynx: Oropharynx is clear.  Eyes:     Extraocular Movements: Extraocular movements intact.     Conjunctiva/sclera: Conjunctivae normal.     Pupils: Pupils are equal, round, and reactive to light.  Cardiovascular:     Rate and Rhythm: Normal rate and regular rhythm.     Pulses: Normal pulses.     Heart sounds: Normal heart sounds.  Pulmonary:     Effort: Pulmonary effort is normal.     Breath sounds: Normal breath sounds.  Musculoskeletal:     Cervical back: Normal range of motion and neck supple.  Neurological:     General: No focal deficit present.     Mental Status: He is alert and oriented to person, place, and time.  Psychiatric:        Mood and Affect: Mood normal.        Behavior: Behavior normal.     ASSESSMENT AND PLAN: 1. Dizziness and giddiness - Increase Meclizine to 50 mg twice daily. Counseled on medication adherence.  - Referral to Neurology for further evaluation and management.  - Follow-up with primary provider as scheduled.  - Ambulatory referral to Neurology - meclizine (ANTIVERT) 50 MG tablet; Take 1 tablet (50 mg total) by mouth 2 (two) times daily as needed for dizziness.  Dispense: 30 tablet; Refill: 1  2. Encounter for completion of form with patient - Discussed with patient in detail I will be unable to complete disability paperwork on today. Explained to patient that he did not follow-up with his PCP Dr. Dorna Mai from 03/18/2022 visit and there is not enough documentation to support completion of disability paperwork on today. I explained to patient this plan was discussed in detail with his PCP Dr. Dorna Mai on today and that she is aware. Patient had questions about the next steps. I explained to patient to schedule an appointment with his PCP on next week prior to paperwork due date 05/22/2022. Patient went on to  state "So you're telling me I don't have any options." I explained to patient that I never stated that  he did not have any options and that he will need to follow-up with his PCP for further advisement. Patient verbalized understanding.     Patient was given the opportunity to ask questions.  Patient verbalized understanding of the plan and was able to repeat key elements of the plan. Patient was given clear instructions to go to Emergency Department or return to medical center if symptoms don't improve, worsen, or new problems develop.The patient verbalized understanding.   Orders Placed This Encounter  Procedures   Ambulatory referral to Neurology     Requested Prescriptions   Signed Prescriptions Disp Refills   meclizine (ANTIVERT) 50 MG tablet 30 tablet 1    Sig: Take 1 tablet (50 mg total) by mouth 2 (two) times daily as needed for dizziness.    Follow-up with primary provider as scheduled.   Camillia Herter, NP

## 2022-05-17 ENCOUNTER — Ambulatory Visit (INDEPENDENT_AMBULATORY_CARE_PROVIDER_SITE_OTHER): Payer: 59 | Admitting: Family

## 2022-05-17 ENCOUNTER — Other Ambulatory Visit: Payer: Self-pay | Admitting: Family Medicine

## 2022-05-17 ENCOUNTER — Encounter: Payer: Self-pay | Admitting: Family

## 2022-05-17 VITALS — BP 142/81 | HR 89 | Ht 68.5 in | Wt 251.4 lb

## 2022-05-17 DIAGNOSIS — R42 Dizziness and giddiness: Secondary | ICD-10-CM | POA: Diagnosis not present

## 2022-05-17 DIAGNOSIS — Z0289 Encounter for other administrative examinations: Secondary | ICD-10-CM | POA: Diagnosis not present

## 2022-05-17 DIAGNOSIS — I10 Essential (primary) hypertension: Secondary | ICD-10-CM

## 2022-05-17 DIAGNOSIS — Z9189 Other specified personal risk factors, not elsewhere classified: Secondary | ICD-10-CM

## 2022-05-17 MED ORDER — MECLIZINE HCL 50 MG PO TABS: 50.0000 mg | ORAL_TABLET | Freq: Two times a day (BID) | ORAL | 1 refills | Status: DC | PRN

## 2022-05-17 NOTE — Telephone Encounter (Signed)
Requested medication (s) are due for refill today: yes  Requested medication (s) are on the active medication list: yes  Last refill:  02/13/22 #90 0 refills   Future visit scheduled: yes today   Notes to clinic:  no refills remain. Do you want to refill Rx?     Requested Prescriptions  Pending Prescriptions Disp Refills   atorvastatin (LIPITOR) 40 MG tablet [Pharmacy Med Name: ATORVASTATIN '40MG'$  TABLETS] 90 tablet 0    Sig: TAKE 1 TABLET BY MOUTH EVERY DAY     Cardiovascular:  Antilipid - Statins Failed - 05/17/2022  3:20 AM      Failed - Lipid Panel in normal range within the last 12 months    Cholesterol, Total  Date Value Ref Range Status  03/28/2020 90 (L) 100 - 199 mg/dL Final   LDL Chol Calc (NIH)  Date Value Ref Range Status  03/28/2020 28 0 - 99 mg/dL Final   HDL  Date Value Ref Range Status  03/28/2020 49 >39 mg/dL Final   Triglycerides  Date Value Ref Range Status  03/28/2020 50 0 - 149 mg/dL Final         Passed - Patient is not pregnant      Passed - Valid encounter within last 12 months    Recent Outpatient Visits           2 months ago Essential hypertension   Primary Care at [facility], MD   1 year ago Essential hypertension   Primary Care at [facility], MD   1 year ago Uncontrolled hypertension   Primary Care at [facility], MD   1 year ago Essential hypertension   Primary Care at [facility] [place], NP   1 year ago Essential hypertension   Primary Care at [facility], [facility], MD       Future Appointments             Today Camillia Herter, NP Primary Care at [facility]   In 2 weeks Dorna Mai, MD Primary Care at [facility]   In 1 month Billey Co, MD [place]             amLODipine ([place]) 10 MG tablet [Pharmacy Med Name: AMLODIPINE BESYLATE '10MG'$  TABLETS] 90 tablet 0    Sig: TAKE 1 TABLET(10 MG) BY MOUTH  DAILY     Cardiovascular: Calcium Channel Blockers 2 Passed - 05/17/2022  3:20 AM      Passed - Last BP in normal range    BP Readings from Last 1 Encounters:  03/18/22 136/82         Passed - Last Heart Rate in normal range    Pulse Readings from Last 1 Encounters:  03/18/22 92         Passed - Valid encounter within last 6 months    Recent Outpatient Visits           2 months ago Essential hypertension   Primary Care at [facility], MD   1 year ago Essential hypertension   Primary Care at [facility], MD   1 year ago Uncontrolled hypertension   Primary Care at [facility], MD   1 year ago Essential hypertension   Primary Care at [facility] [place], NP   1 year ago Essential hypertension   Primary Care at [facility], [facility] Be[facility], MD  Future Appointments             Today Camillia Herter, NP Primary Care at [facility]   In 2 weeks Dorna Mai, MD Primary Care at [facility]   In 1 month [facility] Herbert Seta, MD [place]

## 2022-05-20 ENCOUNTER — Ambulatory Visit (INDEPENDENT_AMBULATORY_CARE_PROVIDER_SITE_OTHER): Payer: 59 | Admitting: Family Medicine

## 2022-05-20 VITALS — BP 131/71 | HR 125 | Temp 98.1°F | Resp 16 | Wt 251.0 lb

## 2022-05-20 DIAGNOSIS — Z0289 Encounter for other administrative examinations: Secondary | ICD-10-CM

## 2022-05-20 DIAGNOSIS — R42 Dizziness and giddiness: Secondary | ICD-10-CM

## 2022-05-20 DIAGNOSIS — I10 Essential (primary) hypertension: Secondary | ICD-10-CM

## 2022-05-21 ENCOUNTER — Encounter: Payer: Self-pay | Admitting: Family Medicine

## 2022-05-21 NOTE — Progress Notes (Signed)
Established Patient Office Visit  Subjective    Patient ID: Roberto Henderson, male    DOB: Apr 02, 1971  Age: 52 y.o. MRN: [id number]  CC:  Chief Complaint  Patient presents with   FMLA    HPI Roberto Henderson presents for follow up of hypertension and vertigo and for completion of forms.    Outpatient Encounter Medications as of 05/20/2022  Medication Sig   acetaminophen (TYLENOL) 500 MG tablet Take 1,000 mg by mouth every 6 (six) hours as needed.   alfuzosin (UROXATRAL) 10 MG 24 hr tablet Take 1 tablet (10 mg total) by mouth daily with breakfast.   amLODipine (NORVASC) 10 MG tablet TAKE 1 TABLET(10 MG) BY MOUTH DAILY (Patient taking differently: Take 10 mg by mouth daily.)   aspirin (EC-81 ASPIRIN) 81 MG EC tablet Take 1 tablet (81 mg total) by mouth daily. Swallow whole.   atorvastatin (LIPITOR) 40 MG tablet TAKE 1 TABLET BY MOUTH EVERY DAY (Patient taking differently: Take 40 mg by mouth daily. TAKE 1 TABLET BY MOUTH EVERY DAY)   hydrochlorothiazide (HYDRODIURIL) 25 MG tablet Take 1 tablet (25 mg total) by mouth daily.   meclizine (ANTIVERT) 50 MG tablet Take 1 tablet (50 mg total) by mouth 2 (two) times daily as needed for dizziness.   metoprolol succinate (TOPROL-XL) 50 MG 24 hr tablet TAKE 1 TABLET(50 MG) BY MOUTH DAILY WITH OR IMMEDIATELY FOLLOWING A MEAL (Patient taking differently: Take 50 mg by mouth daily.)   Multiple Vitamin (MULTIVITAMIN) tablet Take 1 tablet by mouth daily.   Multiple Vitamins-Minerals (CENTRUM ADULT PO) Take 1 tablet by mouth daily.   sildenafil (VIAGRA) 100 MG tablet Take 50-100 mg by mouth daily as needed for erectile dysfunction.   spironolactone (ALDACTONE) 25 MG tablet TAKE 1 TABLET(25 MG) BY MOUTH DAILY   tadalafil (CIALIS) 5 MG tablet Take 1 tablet (5 mg total) by mouth daily as needed for erectile dysfunction.   telmisartan (MICARDIS) 80 MG tablet Take 1 tablet (80 mg total) by mouth daily.   No facility-administered encounter medications on file as  of 05/20/2022.    Past Medical History:  Diagnosis Date   Asthma    as a teenager   Hypertension     Past Surgical History:  Procedure Laterality Date   NO PAST SURGERIES      Family History  Problem Relation Age of Onset   Colon polyps Mother    Hypertension Father    Colon cancer Neg Hx    Esophageal cancer Neg Hx    Rectal cancer Neg Hx    Stomach cancer Neg Hx     Social History   Socioeconomic History   Marital status: Single    Spouse name: Not on file   Number of children: Not on file   Years of education: Not on file   Highest education level: Not on file  Occupational History   Not on file  Tobacco Use   Smoking status: Never   Smokeless tobacco: Never  Vaping Use   Vaping Use: Never used  Substance and Sexual Activity   Alcohol use: Yes    Comment: social drinker   Drug use: Not Currently   Sexual activity: Yes    Birth control/protection: None  Other Topics Concern   Not on file  Social History Narrative   Not on file   Social Determinants of Health   Financial Resource Strain: Not on file  Food Insecurity: Not on file  Transportation Needs: Not on  file  Physical Activity: Not on file  Stress: Not on file  Social Connections: Not on file  Intimate Partner Violence: Not on file    Review of Systems  Neurological:  Positive for dizziness.  All other systems reviewed and are negative.       Objective    BP 131/71   Pulse (!) 125   Temp 98.1 F (36.7 C) (Oral)   Resp 16   Wt 251 lb (113.9 kg)   BMI 37.61 kg/m   Physical Exam Vitals and nursing note reviewed.  Constitutional:      General: He is not in acute distress.    Appearance: He is obese.  Cardiovascular:     Rate and Rhythm: Normal rate and regular rhythm.  Pulmonary:     Effort: Pulmonary effort is normal.     Breath sounds: Normal breath sounds.  Abdominal:     Palpations: Abdomen is soft.     Tenderness: There is no abdominal tenderness.  Musculoskeletal:      Right lower leg: No edema.     Left lower leg: No edema.  Neurological:     General: No focal deficit present.     Mental Status: He is alert and oriented to person, place, and time.         Assessment & Plan:   1. Essential hypertension Appears stable. Continue   2. Vertigo Infrequent episodes now but having follow up scheduled with neuro for further eval/mgt  3. Encounter for completion of form with patient Completion of disability forms so that patient can return to work without restrictions.     No follow-ups on file.   Becky Sax, MD

## 2022-05-24 ENCOUNTER — Other Ambulatory Visit: Payer: Self-pay | Admitting: Family Medicine

## 2022-05-24 DIAGNOSIS — I10 Essential (primary) hypertension: Secondary | ICD-10-CM

## 2022-05-24 MED ORDER — METOPROLOL SUCCINATE ER 50 MG PO TB24: 50.0000 mg | ORAL_TABLET | Freq: Every day | ORAL | 0 refills | Status: DC

## 2022-06-03 ENCOUNTER — Other Ambulatory Visit: Payer: Self-pay | Admitting: Cardiology

## 2022-06-05 ENCOUNTER — Ambulatory Visit: Payer: 59 | Admitting: Family Medicine

## 2022-06-05 ENCOUNTER — Other Ambulatory Visit: Payer: Self-pay

## 2022-06-05 MED ORDER — TELMISARTAN 80 MG PO TABS: 80.0000 mg | ORAL_TABLET | Freq: Every day | ORAL | 0 refills | Status: DC

## 2022-06-05 MED ORDER — HYDROCHLOROTHIAZIDE 25 MG PO TABS: 25.0000 mg | ORAL_TABLET | Freq: Every day | ORAL | 0 refills | Status: DC

## 2022-06-17 ENCOUNTER — Other Ambulatory Visit: Payer: Self-pay | Admitting: Cardiology

## 2022-06-17 NOTE — Telephone Encounter (Signed)
Copied from [place] [id number]. Topic: General - Other >> Jun 14, 2022 11:45 AM Ludger Nutting wrote: Patient states that a section of his FMLA paperwork was filled out incorrectly. Patient would like to discuss what needs to be corrected. Patients states corrected paperwork needs to be submitted to his employer by 2/7. Please follow up with patient.

## 2022-06-18 ENCOUNTER — Ambulatory Visit: Payer: 59 | Admitting: Neurology

## 2022-07-03 ENCOUNTER — Ambulatory Visit: Payer: 59 | Admitting: Urology

## 2022-07-08 ENCOUNTER — Ambulatory Visit: Payer: 59 | Admitting: Urology

## 2022-07-09 ENCOUNTER — Other Ambulatory Visit: Payer: Self-pay | Admitting: Cardiology

## 2022-07-09 NOTE — Telephone Encounter (Signed)
Must schedule follow up or obtain from primary care MD

## 2022-07-10 ENCOUNTER — Ambulatory Visit: Payer: 59 | Admitting: Urology

## 2022-07-22 ENCOUNTER — Other Ambulatory Visit: Payer: Self-pay | Admitting: Urology

## 2022-07-31 ENCOUNTER — Other Ambulatory Visit: Payer: Self-pay | Admitting: Cardiology

## 2022-08-03 ENCOUNTER — Other Ambulatory Visit: Payer: Self-pay | Admitting: Cardiology

## 2022-08-15 ENCOUNTER — Other Ambulatory Visit: Payer: Self-pay | Admitting: Urology

## 2022-08-16 ENCOUNTER — Other Ambulatory Visit: Payer: Self-pay | Admitting: Cardiology

## 2022-08-16 ENCOUNTER — Other Ambulatory Visit: Payer: Self-pay | Admitting: Family Medicine

## 2022-08-16 DIAGNOSIS — Z9189 Other specified personal risk factors, not elsewhere classified: Secondary | ICD-10-CM

## 2022-08-16 DIAGNOSIS — I10 Essential (primary) hypertension: Secondary | ICD-10-CM

## 2022-08-20 ENCOUNTER — Telehealth: Payer: Self-pay

## 2022-08-20 DIAGNOSIS — N401 Enlarged prostate with lower urinary tract symptoms: Secondary | ICD-10-CM

## 2022-08-20 MED ORDER — ALFUZOSIN HCL ER 10 MG PO TB24: 10.0000 mg | ORAL_TABLET | Freq: Every day | ORAL | 0 refills | Status: DC

## 2022-08-20 NOTE — Telephone Encounter (Signed)
Short term RX sent in.  

## 2022-08-20 NOTE — Telephone Encounter (Signed)
Pt would like a refill of Alfuzosin 10mg .   He missed his 1 year f/u in 07-10-2022 due to a death in the family. Appt r/s to 4/17 845 in [place].   Pharmacy - [address]   Pls advise.

## 2022-08-22 MED ORDER — ATORVASTATIN CALCIUM 40 MG PO TABS: 40.0000 mg | ORAL_TABLET | Freq: Every day | ORAL | 0 refills | Status: DC

## 2022-08-22 MED ORDER — AMLODIPINE BESYLATE 10 MG PO TABS: 10.0000 mg | ORAL_TABLET | Freq: Every day | ORAL | 0 refills | Status: DC

## 2022-08-22 MED ORDER — METOPROLOL SUCCINATE ER 50 MG PO TB24: 50.0000 mg | ORAL_TABLET | Freq: Every day | ORAL | 0 refills | Status: DC

## 2022-08-23 ENCOUNTER — Other Ambulatory Visit: Payer: Self-pay | Admitting: Cardiology

## 2022-08-28 ENCOUNTER — Encounter: Payer: Self-pay | Admitting: Urology

## 2022-08-28 ENCOUNTER — Telehealth: Payer: Self-pay | Admitting: Cardiology

## 2022-08-28 ENCOUNTER — Other Ambulatory Visit
Admission: RE | Admit: 2022-08-28 | Discharge: 2022-08-28 | Disposition: A | Discharge status: Home / Self Care | Payer: 59 | Attending: Urology | Admitting: Urology

## 2022-08-28 ENCOUNTER — Ambulatory Visit (INDEPENDENT_AMBULATORY_CARE_PROVIDER_SITE_OTHER): Payer: 59 | Admitting: Physician Assistant

## 2022-08-28 ENCOUNTER — Ambulatory Visit (INDEPENDENT_AMBULATORY_CARE_PROVIDER_SITE_OTHER): Payer: 59 | Admitting: Urology

## 2022-08-28 ENCOUNTER — Encounter: Payer: Self-pay | Admitting: Physician Assistant

## 2022-08-28 VITALS — BP 150/92 | HR 89 | Ht 69.0 in | Wt 253.0 lb

## 2022-08-28 VITALS — HR 93 | Ht 69.0 in | Wt 255.0 lb

## 2022-08-28 DIAGNOSIS — G4733 Obstructive sleep apnea (adult) (pediatric): Secondary | ICD-10-CM

## 2022-08-28 DIAGNOSIS — R011 Cardiac murmur, unspecified: Secondary | ICD-10-CM

## 2022-08-28 DIAGNOSIS — R3914 Feeling of incomplete bladder emptying: Secondary | ICD-10-CM | POA: Insufficient documentation

## 2022-08-28 DIAGNOSIS — I10 Essential (primary) hypertension: Secondary | ICD-10-CM | POA: Diagnosis not present

## 2022-08-28 DIAGNOSIS — Z125 Encounter for screening for malignant neoplasm of prostate: Secondary | ICD-10-CM

## 2022-08-28 DIAGNOSIS — E7849 Other hyperlipidemia: Secondary | ICD-10-CM

## 2022-08-28 DIAGNOSIS — N529 Male erectile dysfunction, unspecified: Secondary | ICD-10-CM

## 2022-08-28 DIAGNOSIS — N401 Enlarged prostate with lower urinary tract symptoms: Secondary | ICD-10-CM | POA: Insufficient documentation

## 2022-08-28 DIAGNOSIS — N1831 Chronic kidney disease, stage 3a: Secondary | ICD-10-CM

## 2022-08-28 MED ORDER — TELMISARTAN 80 MG PO TABS: ORAL_TABLET | ORAL | 3 refills | Status: DC

## 2022-08-28 MED ORDER — HYDROCHLOROTHIAZIDE 25 MG PO TABS: 25.0000 mg | ORAL_TABLET | Freq: Every day | ORAL | 3 refills | Status: DC

## 2022-08-28 MED ORDER — ALFUZOSIN HCL ER 10 MG PO TB24: 10.0000 mg | ORAL_TABLET | Freq: Every day | ORAL | 0 refills | Status: DC

## 2022-08-28 MED ORDER — FINASTERIDE 5 MG PO TABS
5.0000 mg | ORAL_TABLET | Freq: Every day | ORAL | 11 refills | Status: DC
Start: 2022-08-28 — End: 2023-02-19

## 2022-08-28 MED ORDER — TADALAFIL 10 MG PO TABS: 10.0000 mg | ORAL_TABLET | Freq: Every day | ORAL | 3 refills | Status: DC | PRN

## 2022-08-28 NOTE — Telephone Encounter (Signed)
*  STAT* If patient is at the pharmacy, call can be transferred to refill team.   1. Which medications need to be refilled? (please list name of each medication and dose if known)  hydrochlorothiazide (HYDRODIURIL) 25 MG tablet telmisartan (MICARDIS) 80 MG tablet  2. Which pharmacy/location (including street and city if local pharmacy) is medication to be sent to? [id number] - [place], [place] - [address]   3. Do they need a 30 day or 90 day supply? 90 day   Pt has an appt today with Lenze PA at 12:45pm.

## 2022-08-28 NOTE — Telephone Encounter (Signed)
Pt's medications were sent to pt's pharmacy as requested. Confirmation received.  

## 2022-08-28 NOTE — Patient Instructions (Signed)
Holmium Laser Enucleation of the Prostate (HoLEP)  HoLEP is a treatment for men with benign prostatic hyperplasia (BPH). The laser surgery removed blockages of urine flow, and is done without any incisions on the body.     What is HoLEP?  HoLEP is a type of laser surgery used to treat obstruction (blockage) of urine flow as a result of benign prostatic hyperplasia (BPH). In men with BPH, the prostate gland is not cancerous, but has become enlarged. An enlarged prostate can result in a number of urinary tract symptoms such as weak urinary stream, difficulty in starting urination, inability to urinate, frequent urination, or getting up at night to urinate.  HoLEP was developed in the 1990's as a more effective and less expensive surgical option for BPH, compared to other surgical options such as laser vaporization(PVP/greenlight laser), transurethral resection of the prostate(TURP), and open simple prostatectomy.   What happens during a HoLEP?  HoLEP requires general anesthesia ("asleep" throughout the procedure).   An antibiotic is given to reduce the risk of infection  A surgical instrument called a resectoscope is inserted through the urethra (the tube that carries urine from the bladder). The resectoscope has a camera that allows the surgeon to view the internal structure of the prostate gland, and to see where the incisions are being made during surgery.  The laser is inserted into the resectoscope and is used to enucleate (free up) the enlarged prostate tissue from the capsule (outer shell) and then to seal up any blood vessels. The tissue that has been removed is pushed back into the bladder.  A morcellator is placed through the resectoscope, and is used to suction out the prostate tissue that has been pushed into the bladder.  When the prostate tissue has been removed, the resectoscope is removed, and a foley catheter is placed to allow healing and drain the urine from the  bladder.     What happens after a HoLEP?  More than 90% of patients go home the same day a few hours after surgery. Less than 10% will be admitted to the hospital overnight for observation to monitor the urine, or if they have other medical problems.  Fluid is flushed through the catheter for about 1 hour after surgery to clear any blood from the urine. It is normal to have some blood in the urine after surgery. The need for blood transfusion is extremely rare.  Eating and drinking are permitted after the procedure once the patient has fully awakened from anesthesia.  The catheter is usually removed 2-3 days after surgery- the patient will come to clinic to have the catheter removed and make sure they can urinate on their own.  It is very important to drink lots of fluids after surgery for one week to keep the bladder flushed.  At first, there may be some burning with urination, but this typically improved within a few hours to days. Most patients do not have a significant amount of pain, and narcotic pain medications are rarely needed.  Symptoms of urinary frequency, urgency, and even leakage are NORMAL for the first few weeks after surgery as the bladder adjusts after having to work hard against blockage from the prostate for many years. This will improve, but can sometimes take several months.  The use of pelvic floor exercises (Kegel exercises) can help improve problems with urinary incontinence.   After catheter removal, patients will be seen at 6 weeks and 6 months for symptom check  No heavy lifting for   at least 2-3 weeks after surgery, however patients can walk and do light activities the first day after surgery. Return to work time depends on occupation.    What are the advantages of HoLEP?  HoLEP has been studied in many different parts of the world and has been shown to be a safe and effective procedure. Although there are many types of BPH surgeries available, HoLEP offers a  unique advantage in being able to remove a large amount of tissue without any incisions on the body, even in very large prostates, while decreasing the risk of bleeding and providing tissue for pathology (to look for cancer). This decreases the need for blood transfusions during surgery, minimizes hospital stay, and reduces the risk of needing repeat treatment.  What are the side effects of HoLEP?  Temporary burning and bleeding during urination. Some blood may be seen in the urine for weeks after surgery and is part of the healing process.  Urinary incontinence (inability to control urine flow) is expected in all patients immediately after surgery and they should wear pads for the first few days/weeks. This typically improves over the course of several weeks. Performing Kegel exercises can help decrease leakage from stress maneuvers such as coughing, sneezing, or lifting. The rate of long term leakage is very low. Patients may also have leakage with urgency and this may be treated with medication. The risk of urge incontinence can be dependent on several factors including age, prostate size, symptoms, and other medical problems.  Retrograde ejaculation or "backwards ejaculation." In 75% of cases, the patient will not see any fluid during ejaculation after surgery.  Erectile function is generally not significantly affected.   What are the risks of HoLEP?  Injury to the urethra or development of scar tissue at a later date  Injury to the capsule of the prostate (typically treated with longer catheterization).  Injury to the bladder or ureteral orifices (where the urine from the kidney drains out)  Infection of the bladder, testes, or kidneys  Return of urinary obstruction at a later date requiring another operation (<2%)  Need for blood transfusion or re-operation due to bleeding  Failure to relieve all symptoms and/or need for prolonged catheterization after surgery  5-15% of patients are  found to have previously undiagnosed prostate cancer in their specimen. Prostate cancer can be treated after HoLEP.  Standard risks of anesthesia including blood clots, heart attacks, etc  When should I call my doctor?  Fever over 101.3 degrees  Inability to urinate, or large blood clots in the urine   --------------------------------------------------------------  Prostatic Urethral Lift (UroLift)  Prostatic urethral lift is a surgical procedure to treat symptoms of prostate gland enlargement that occurs with age (benign prostatic hypertrophy, BPH). The urethra passes between the two lobes of the prostate. The urethra is the part of the body that drains urine from the bladder. As the prostate enlarges, it can push on the urethra and cause problems with urinating. This procedure involves placing an implant that holds the prostate away from the urethra. The procedure is done using a thin device called a cystoscope. The device is inserted through the tip of the penis and moved up the urethra to the prostate. This is less invasive than other procedures that require an incision. You may have this procedure if: You have symptoms of BPH. Your prostate is not severely enlarged. Medicines to treat BPH are not working or not tolerated. You want to avoid possible sexual side effects from medicines or  other procedures that are used to treat BPH. Tell a health care provider about: Any allergies you have. All medicines you are taking, including vitamins, herbs, eye drops, creams, and over-the-counter medicines. Any problems you or family members have had with anesthetic medicines. Any bleeding problems you have. Any surgeries you have had. Any medical conditions you have. What are the risks? Generally, this is a safe procedure. However, problems may occur, including: Bleeding. Infection. Leaking of urine (incontinence). Allergic reactions to medicines. Return of BPH symptoms after 2 years,  requiring more treatment. What happens before the procedure? When to stop eating and drinking Follow instructions from your health care provider about what you may eat and drink before your procedure. These may include: 8 hours before your procedure Stop eating most foods. Do not eat meat, fried foods, or fatty foods. Eat only light foods, such as toast or crackers. All liquids are okay except energy drinks and alcohol. 6 hours before your procedure Stop eating. Drink only clear liquids, such as water, clear fruit juice, black coffee, plain tea, and sports drinks. Do not drink energy drinks or alcohol. 2 hours before your procedure Stop drinking all liquids. You may be allowed to take medicines with small sips of water. If you do not follow your health care provider's instructions, your procedure may be delayed or canceled. Medicines Ask your health care provider about: Changing or stopping your regular medicines. This is especially important if you are taking diabetes medicines or blood thinners. Taking medicines such as aspirin and ibuprofen. These medicines can thin your blood. Do not take these medicines unless your health care provider tells you to take them. Taking over-the-counter medicines, vitamins, herbs, and supplements. Surgery safety Ask your health care provider what steps will be taken to help prevent infection. These steps may include: Removing hair at the surgery site. Washing skin with a germ-killing soap. Taking antibiotic medicine. General instructions Do not use any products that contain nicotine or tobacco for at least 4 weeks before the procedure. These products include cigarettes, chewing tobacco, and vaping devices, such as e-cigarettes. If you need help quitting, ask your health care provider. If you will be going home right after the procedure, plan to have a responsible adult: Take you home from the hospital or clinic. You will not be allowed to drive. Care  for you for the time you are told. What happens during the procedure? An IV may be inserted into one of your veins. You will be given one or more of the following: A medicine to help you relax (sedative). A medicine that is injected into your urethra to numb the area (local anesthetic). A medicine to make you fall asleep (general anesthetic). A cystoscope will be inserted into your penis and moved through your urethra to your prostate. A device will be inserted through the cystoscope and used to press the lobes of your prostate away from your urethra. Implants will be inserted through the device to hold the lobes of your prostate in the widened position. The device and cystoscope will be removed. The procedure may vary among health care providers and hospitals. What happens after the procedure? Your blood pressure, heart rate, breathing rate, and blood oxygen level be monitored until you leave the hospital or clinic. If you were given a sedative during the procedure, it can affect you for several hours. Do not drive or operate machinery until your health care provider says that it is safe. Summary Prostatic urethral lift is a surgical  procedure to relieve symptoms of prostate gland enlargement that occurs with age (benign prostatic hypertrophy, BPH). The procedure is performed with a thin device called a cystoscope. This device is inserted through the tip of the penis and moved up the urethra to reach the prostate. This is less invasive than other procedures that require an incision. If you will be going home right after the procedure, plan to have a responsible adult take you home from the hospital or clinic. You will not be allowed to drive. This information is not intended to replace advice given to you by your health care provider. Make sure you discuss any questions you have with your health care provider. Document Revised: 11/24/2020 Document Reviewed: 11/24/2020 Elsevier Patient Education   2023 [employer].

## 2022-08-28 NOTE — Telephone Encounter (Signed)
Pt's medications were resent to pt's preferred pharmacy. Confirmation received.  

## 2022-08-28 NOTE — Progress Notes (Signed)
Cardiology Office Note:    Date:  08/28/2022   ID:  Roberto Henderson, DOB 10-04-1970, MRN [id number]  PCP:  Georganna Skeans, MD  [facility] Providers Cardiologist:  Donato Schultz, MD     Referring MD: Georganna Skeans, MD   Chief Complaint:  Follow-up and Hypertension     History of Present Illness:   Roberto Henderson is a 52 y.o. male with history of HTN, HLD, OSA on cpap, CKD3.  Patient saw Dr. Anne Fu 02/2021 and difficult to control HTN. Losartan changed to telmisartan, HCTZ increased and continued on amlodipine, toprol and spiro. Consider changing toprol to coreg. Echo ordered for murmur and never done.      Patient comes in for f/u. Has been out of telmisartan and HCTZ for at least 2 weeks. BP high since he's been out of meds. When he's on it BP 115-125/70's.  Not watching salt closely. Eats frozen dinners. Not using cpap regularly.        Past Medical History:  Diagnosis Date   Asthma    as a teenager   Hypertension    Current Medications: Current Meds  Medication Sig   alfuzosin (UROXATRAL) 10 MG 24 hr tablet Take 1 tablet (10 mg total) by mouth daily with breakfast.   amLODipine (NORVASC) 10 MG tablet Take 1 tablet (10 mg total) by mouth daily.   aspirin (EC-81 ASPIRIN) 81 MG EC tablet Take 1 tablet (81 mg total) by mouth daily. Swallow whole.   atorvastatin (LIPITOR) 40 MG tablet Take 1 tablet (40 mg total) by mouth daily.   finasteride (PROSCAR) 5 MG tablet Take 1 tablet (5 mg total) by mouth daily.   metoprolol succinate (TOPROL-XL) 50 MG 24 hr tablet Take 1 tablet (50 mg total) by mouth daily. Take with or immediately following a meal.   Multiple Vitamin (MULTIVITAMIN) tablet Take 1 tablet by mouth daily.   Multiple Vitamins-Minerals (CENTRUM ADULT PO) Take 1 tablet by mouth daily.   spironolactone (ALDACTONE) 25 MG tablet TAKE 1 TABLET(25 MG) BY MOUTH DAILY   tadalafil (CIALIS) 10 MG tablet Take 1 tablet (10 mg total) by mouth daily as needed for erectile  dysfunction.   [DISCONTINUED] hydrochlorothiazide (HYDRODIURIL) 25 MG tablet Take 1 tablet (25 mg total) by mouth daily. Over due for appointment. Please call our office to schedule   [DISCONTINUED] telmisartan (MICARDIS) 80 MG tablet TAKE 1 TABLET(80 MG) BY MOUTH DAILY    Allergies:   Patient has no known allergies.   Social History   Tobacco Use   Smoking status: Never    Passive exposure: Never   Smokeless tobacco: Never  Vaping Use   Vaping Use: Never used  Substance Use Topics   Alcohol use: Yes    Comment: social drinker   Drug use: Not Currently    Family Hx: The patient's family history includes Colon polyps in his mother; Hypertension in his father. There is no history of Colon cancer, Esophageal cancer, Rectal cancer, or Stomach cancer.  ROS     Physical Exam:    VS:  BP (!) 150/92   Pulse 89   Ht  (1.753 m)   Wt 253 lb (114.8 kg)   BMI 37.36 kg/m     Wt Readings from Last 3 Encounters:  08/28/22 253 lb (114.8 kg)  08/28/22 255 lb (115.7 kg)  05/20/22 251 lb (113.9 kg)    Physical Exam  NUU:VOZDG,UY no acute distress  Neck: no JVD, carotid bruits, or masses Cardiac:RRR;1/6  systolic murmur LSB Respiratory:  clear to auscultation bilaterally, normal work of breathing GI: soft, nontender, nondistended, + BS Ext: without cyanosis, clubbing, or edema, Good distal pulses bilaterally Neuro:  Alert and Oriented x 3,  Psych: euthymic mood, full affect        EKGs/Labs/Other Test Reviewed:    EKG:  EKG is   ordered today.  The ekg ordered today demonstrates  NSR Poor ant r wave progression  Recent Labs: 03/07/2022: BUN 27; Creatinine, Ser 1.78; Hemoglobin 14.3; Platelets 351; Potassium 3.5; Sodium 140   Recent Lipid Panel No results for input(s): "CHOL", "TRIG", "HDL", "VLDL", "LDLCALC", "LDLDIRECT" in the last 8760 hours.   Prior CV Studies:       Risk Assessment/Calculations/Metrics:         HYPERTENSION CONTROL Vitals:   08/28/22 1237  08/28/22 1315  BP: (!) 150/89 (!) 150/92    The patient's blood pressure is elevated above target today.  In order to address the patient's elevated BP: A current anti-hypertensive medication was adjusted today.; Blood pressure will be monitored at home to determine if medication changes need to be made.       ASSESSMENT & PLAN:   No problem-specific Assessment & Plan notes found for this encounter.   HTN BP running high-has been out of telmisartan and HCTZ, also eating frozen dinners. Refill meds and he'll send message with readings from home, 2 gm sodium diet and regular exercise.  OSA on CPAP but not using-discussed compliance importance  HLD-on lipitor -not fasting  CKD3 Crt 1.78 02/2022-recheck today  Systolic murmur-never had echo done-will order.            Dispo:  No follow-ups on file.   Medication Adjustments/Labs and Tests Ordered: Current medicines are reviewed at length with the patient today.  Concerns regarding medicines are outlined above.  Tests Ordered: Orders Placed This Encounter  Procedures   Comprehensive metabolic panel   CBC   EKG 12-Lead   ECHOCARDIOGRAM COMPLETE   Medication Changes: Meds ordered this encounter  Medications   hydrochlorothiazide (HYDRODIURIL) 25 MG tablet    Sig: Take 1 tablet (25 mg total) by mouth daily.    Dispense:  90 tablet    Refill:  3   telmisartan (MICARDIS) 80 MG tablet    Sig: TAKE 1 TABLET(80 MG) BY MOUTH DAILY    Dispense:  90 tablet    Refill:  3   Signed, Jacolyn Reedy, PA-C  08/28/2022 1:20 PM    [facility] [address] [place], [place], [place]  [address] Phone: [phone]; Fax: [phone]

## 2022-08-28 NOTE — Progress Notes (Signed)
   08/28/2022 10:24 AM   [name] 1971-01-17 [id number]  Reason for visit: Follow up BPH, ED, PSA screening  HPI: 52 year old male with urinary symptoms of urinary frequency during the day and weak stream who has had significant improvement on alfuzosin.  When he is off the medication his symptoms worsen significantly.  IPSS score today is 24, with quality-of-life mostly dissatisfied, PVR 89ml.  He previously underwent a cystoscopy in December 2021 that showed a moderate to large prostate with a high bladder neck and lateral lobe hypertrophy and mild trabeculations, and he opted to hold off on any consideration of an outlet procedure.  We had previously discussed need for a transrectal ultrasound to evaluate prostate volume prior to pursuing any outlet procedure.  He may be interested in an outlet procedure in the future, and we discussed the importance of evaluating prostate volume with TRUS prior to considering UroLift or HOLEP.  He will think about this moving forward.  We discussed other alternative options like addition of finasteride, and he would like to move forward with this.  Risk and benefits discussed, as well as impact on PSA.  In terms of the erections, he has taken sildenafil and Cialis in the past.  He feels like the Cialis works best, he currently has been on 5 mg dosing and wonders if he can increase this dose.  We discussed the different options for taking Cialis, and that the max dose would be 20 mg daily, he would like to start with the 10 mg daily dose.  Finally, regarding PSA screening, most recent PSA was normal at 2.2 in September 2021, due for repeat PSA today, will call with those results.  Alfuzosin refilled, trial of finasteride 5 mg daily-> consider transrectal ultrasound in the future if worsening symptoms and consideration of outlet procedure Cialis refilled for ED, 10 mg daily with 10 mg boost as needed RTC 6 months PVR    Sondra Come,  MD  [facility] [address] [place], [place] [address] [phone]

## 2022-08-28 NOTE — Patient Instructions (Signed)
Medication Instructions:  Your physician recommends that you continue on your current medications as directed. Please refer to the Current Medication list given to you today.  *If you need a refill on your cardiac medications before your next appointment, please call your pharmacy*   Lab Work: None ordered   If you have labs (blood work) drawn today and your tests are completely normal, you will receive your results only by: MyChart Message (if you have MyChart) OR A paper copy in the mail If you have any lab test that is abnormal or we need to change your treatment, we will call you to review the results.   Testing/Procedures: Your physician has requested that you have an echocardiogram. Echocardiography is a painless test that uses sound waves to create images of your heart. It provides your doctor with information about the size and shape of your heart and how well your heart's chambers and valves are working. This procedure takes approximately one hour. There are no restrictions for this procedure. Please do NOT wear cologne, perfume, aftershave, or lotions (deodorant is allowed). Please arrive 15 minutes prior to your appointment time.    Follow-Up: At [facility], you and your health needs are our priority.  As part of our continuing mission to provide you with exceptional heart care, we have created designated Provider Care Teams.  These Care Teams include your primary Cardiologist (physician) and Advanced Practice Providers (APPs -  Physician Assistants and Nurse Practitioners) who all work together to provide you with the care you need, when you need it.  We recommend signing up for the patient portal called "MyChart".  Sign up information is provided on this After Visit Summary.  MyChart is used to connect with patients for Virtual Visits (Telemedicine).  Patients are able to view lab/test results, encounter notes, upcoming appointments, etc.  Non-urgent messages can be  sent to your provider as well.   To learn more about what you can do with MyChart, go to [email or web address].    Your next appointment:   6 month(s)  Provider:   Dr. Donato Schultz    Other Instructions

## 2022-08-28 NOTE — Addendum Note (Signed)
Addended by: Margaret Pyle D on: 08/28/2022 01:52 PM   Modules accepted: Orders

## 2022-08-29 LAB — COMPREHENSIVE METABOLIC PANEL
ALT: 82 IU/L — ABNORMAL HIGH (ref 0–44)
AST: 37 IU/L (ref 0–40)
Albumin/Globulin Ratio: 1.5 (ref 1.2–2.2)
Albumin: 4.4 g/dL (ref 3.8–4.9)
Alkaline Phosphatase: 137 IU/L — ABNORMAL HIGH (ref 44–121)
BUN/Creatinine Ratio: 11 (ref 9–20)
BUN: 14 mg/dL (ref 6–24)
Bilirubin Total: 0.3 mg/dL (ref 0.0–1.2)
CO2: 23 mmol/L (ref 20–29)
Calcium: 9.6 mg/dL (ref 8.7–10.2)
Chloride: 102 mmol/L (ref 96–106)
Creatinine, Ser: 1.32 mg/dL — ABNORMAL HIGH (ref 0.76–1.27)
Globulin, Total: 2.9 g/dL (ref 1.5–4.5)
Glucose: 87 mg/dL (ref 70–99)
Potassium: 4.3 mmol/L (ref 3.5–5.2)
Sodium: 141 mmol/L (ref 134–144)
Total Protein: 7.3 g/dL (ref 6.0–8.5)
eGFR: 64 mL/min/{1.73_m2} (ref >59)

## 2022-08-29 LAB — CBC
Hematocrit: 37.8 % (ref 37.5–51.0)
Hemoglobin: 12.4 g/dL — ABNORMAL LOW (ref 13.0–17.7)
MCH: 28.5 pg (ref 26.6–33.0)
MCHC: 32.8 g/dL (ref 31.5–35.7)
MCV: 87 fL (ref 79–97)
Platelets: 289 10*3/uL (ref 150–450)
RBC: 4.35 x10E6/uL (ref 4.14–5.80)
RDW: 13.5 % (ref 11.6–15.4)
WBC: 9.3 10*3/uL (ref 3.4–10.8)

## 2022-08-30 LAB — PSA, TOTAL AND FREE
PSA, Free Pct: 27.6 %
PSA, Free: 0.47 ng/mL
Prostate Specific Ag, Serum: 1.7 ng/mL (ref 0.0–4.0)

## 2022-09-03 ENCOUNTER — Ambulatory Visit (INDEPENDENT_AMBULATORY_CARE_PROVIDER_SITE_OTHER): Payer: 59 | Admitting: Family Medicine

## 2022-09-03 ENCOUNTER — Encounter: Payer: Self-pay | Admitting: Family Medicine

## 2022-09-03 VITALS — BP 136/82 | HR 96 | Temp 98.3°F | Ht 69.0 in | Wt 248.8 lb

## 2022-09-03 DIAGNOSIS — I10 Essential (primary) hypertension: Secondary | ICD-10-CM

## 2022-09-03 DIAGNOSIS — G4733 Obstructive sleep apnea (adult) (pediatric): Secondary | ICD-10-CM

## 2022-09-03 DIAGNOSIS — Z9189 Other specified personal risk factors, not elsewhere classified: Secondary | ICD-10-CM | POA: Diagnosis not present

## 2022-09-03 DIAGNOSIS — R739 Hyperglycemia, unspecified: Secondary | ICD-10-CM

## 2022-09-03 DIAGNOSIS — N1831 Chronic kidney disease, stage 3a: Secondary | ICD-10-CM | POA: Diagnosis not present

## 2022-09-03 DIAGNOSIS — R7401 Elevation of levels of liver transaminase levels: Secondary | ICD-10-CM | POA: Insufficient documentation

## 2022-09-03 MED ORDER — ATORVASTATIN CALCIUM 40 MG PO TABS: 40.0000 mg | ORAL_TABLET | Freq: Every day | ORAL | 1 refills | Status: DC

## 2022-09-03 NOTE — Assessment & Plan Note (Signed)
Stable.  Advised patient to continue taking his blood pressure medication as this is the most likely cause of his chronic kidney disease.

## 2022-09-03 NOTE — Progress Notes (Signed)
New Patient Office Visit  Subjective    Patient ID: Roberto Henderson, male    DOB: 08/26/1970  Age: 52 y.o. MRN: [id number] BATESC:  Chief Complaint  Patient presents with   Establish Care    HPI Roberto Henderson presents to establish care  Pt was previously seeing a provider at [facility] but this location is closer to his house.    Recently saw his cardiologist.  Was restarted on some of his medications for bp.  Currently compliant with medications.   He has a cpap machine for osa but doesn't wear it.  He says he has decided to take his health more seriously and plans to start wearing it tonight.    He has not taken his statin in several months.  We discussed restarting it and getting a lipid panel.    Pt states he has gained about 30 lbs of weight over the past year.  He works from home.  No new stress in his relationships.  He expressed a desire to lose weight.    Pt states he had a episode of severe vertigo a few months ago and is now just starting to feel 100% back to normal in regards to his balance.    Bph- recently started taking a new medication for bph, finasteride.  Has not noticed any effects good or bad from this medication so far.    The ASCVD Risk score (Arnett DK, et al., 2019) failed to calculate for the following reasons:   The valid total cholesterol range is 130 to 320 mg/dL   PSH: no surgeries.   FH: na  Tobacco use: smokes a cigar once ina blue moon.  Alcohol use: seldom.  Less than 1x week.  Drug use: no Marital status: in a relationship Employment: works from home.    Outpatient Encounter Medications as of 09/03/2022  Medication Sig   alfuzosin (UROXATRAL) 10 MG 24 hr tablet Take 1 tablet (10 mg total) by mouth daily with breakfast.   amLODipine (NORVASC) 10 MG tablet Take 1 tablet (10 mg total) by mouth daily.   aspirin (EC-81 ASPIRIN) 81 MG EC tablet Take 1 tablet (81 mg total) by mouth daily. Swallow whole.   finasteride (PROSCAR) 5 MG tablet  Take 1 tablet (5 mg total) by mouth daily.   hydrochlorothiazide (HYDRODIURIL) 25 MG tablet Take 1 tablet (25 mg total) by mouth daily.   metoprolol succinate (TOPROL-XL) 50 MG 24 hr tablet Take 1 tablet (50 mg total) by mouth daily. Take with or immediately following a meal.   Multiple Vitamin (MULTIVITAMIN) tablet Take 1 tablet by mouth daily.   Multiple Vitamins-Minerals (CENTRUM ADULT PO) Take 1 tablet by mouth daily.   spironolactone (ALDACTONE) 25 MG tablet TAKE 1 TABLET(25 MG) BY MOUTH DAILY   tadalafil (CIALIS) 10 MG tablet Take 1 tablet (10 mg total) by mouth daily as needed for erectile dysfunction.   telmisartan (MICARDIS) 80 MG tablet TAKE 1 TABLET(80 MG) BY MOUTH DAILY   [DISCONTINUED] atorvastatin (LIPITOR) 40 MG tablet Take 1 tablet (40 mg total) by mouth daily.   atorvastatin (LIPITOR) 40 MG tablet Take 1 tablet (40 mg total) by mouth daily.   No facility-administered encounter medications on file as of 09/03/2022.    Past Medical History:  Diagnosis Date   Asthma    as a teenager   Hypertension     Past Surgical History:  Procedure Laterality Date   NO PAST SURGERIES  Family History  Problem Relation Age of Onset   Colon polyps Mother    Hypertension Father    Colon cancer Neg Hx    Esophageal cancer Neg Hx    Rectal cancer Neg Hx    Stomach cancer Neg Hx     Social History   Socioeconomic History   Marital status: Single    Spouse name: Not on file   Number of children: Not on file   Years of education: Not on file   Highest education level: Not on file  Occupational History   Not on file  Tobacco Use   Smoking status: Never    Passive exposure: Never   Smokeless tobacco: Never  Vaping Use   Vaping Use: Never used  Substance and Sexual Activity   Alcohol use: Yes    Comment: social drinker   Drug use: Not Currently   Sexual activity: Yes    Birth control/protection: None  Other Topics Concern   Not on file  Social History Narrative    Not on file   Social Determinants of Health   Financial Resource Strain: Not on file  Food Insecurity: Not on file  Transportation Needs: Not on file  Physical Activity: Not on file  Stress: Not on file  Social Connections: Not on file  Intimate Partner Violence: Not on file    ROS      Objective    BP 136/82   Pulse 96   Temp 98.3 F (36.8 C) (Oral)   Ht 5\' 9"  (1.753 m)   Wt 248 lb 12 oz (112.8 kg)   SpO2 98%   BMI 36.73 kg/m   Physical Exam Gen: alert, oriented, overweight male appears stated age Heent: perrla, moist oral mucosa.  Normal TM Cv: rrr Pulm: lctab Gi: soft, nontender.  Nbs Msk: equal strength b/l      Assessment & Plan:   Problem List Items Addressed This Visit       Other   Candidate for statin therapy due to risk of future cardiovascular event   Relevant Medications   atorvastatin (LIPITOR) 40 MG tablet   Other Visit Diagnoses     Hyperglycemia    -  Primary   Relevant Orders   HgB A1c   Lipid Profile   Elevated ALT measurement       Relevant Orders   Hepatic function panel       Return in about 4 weeks (around 10/01/2022) for osa, weight.   Sandre Kitty, MD

## 2022-09-03 NOTE — Assessment & Plan Note (Signed)
Elevated ALT on recent CMP.  Previously had been normal.  Will recheck.  If continues to be elevated can do further workup for possible NAFLD.

## 2022-09-03 NOTE — Assessment & Plan Note (Signed)
Patient states he will begin using his CPAP machine tonight.  Discussed the importance of treating OSA, not just for symptomatic relief but also for long-term cardiovascular health.

## 2022-09-03 NOTE — Assessment & Plan Note (Signed)
Recently noncompliant with medication.  Restarting today Checking LDL today Will need follow-up in approximately 6-8 weeks.

## 2022-09-03 NOTE — Assessment & Plan Note (Signed)
Recheck A1c today 

## 2022-09-03 NOTE — Patient Instructions (Signed)
It was nice to meet you today,   - please wear your cpap nightly.    - please restart your statin. I have sent in a refill  - please continue to take your other medications.   - we ordered some labs and will let you know the results when we get them   Have a great day,   Dr. Constance Goltz

## 2022-09-03 NOTE — Assessment & Plan Note (Signed)
Recently had medications refilled by cardiologist.  Was sent to cardiology previously for heart to treat hypertension.  Blood pressure better today.  Continue medications.

## 2022-09-04 LAB — HEPATIC FUNCTION PANEL
ALT: 67 IU/L — ABNORMAL HIGH (ref 0–44)
AST: 27 IU/L (ref 0–40)
Albumin: 4.7 g/dL (ref 3.8–4.9)
Alkaline Phosphatase: 120 IU/L (ref 44–121)
Bilirubin Total: 0.4 mg/dL (ref 0.0–1.2)
Bilirubin, Direct: 0.14 mg/dL (ref 0.00–0.40)
Total Protein: 7.8 g/dL (ref 6.0–8.5)

## 2022-09-04 LAB — LIPID PANEL
Chol/HDL Ratio: 3.9 ratio (ref 0.0–5.0)
Cholesterol, Total: 160 mg/dL (ref 100–199)
HDL: 41 mg/dL (ref >39)
LDL Chol Calc (NIH): 92 mg/dL (ref 0–99)
Triglycerides: 154 mg/dL — ABNORMAL HIGH (ref 0–149)
VLDL Cholesterol Cal: 27 mg/dL (ref 5–40)

## 2022-09-04 LAB — HEMOGLOBIN A1C
Est. average glucose Bld gHb Est-mCnc: 131 mg/dL
Hgb A1c MFr Bld: 6.2 % — ABNORMAL HIGH (ref 4.8–5.6)

## 2022-09-27 ENCOUNTER — Ambulatory Visit (HOSPITAL_COMMUNITY): Payer: 59 | Attending: Internal Medicine

## 2022-09-27 DIAGNOSIS — I10 Essential (primary) hypertension: Secondary | ICD-10-CM | POA: Diagnosis present

## 2022-09-27 LAB — ECHOCARDIOGRAM COMPLETE
Area-P 1/2: 3.46 cm2
S' Lateral: 2.85 cm

## 2022-09-27 MED ORDER — PERFLUTREN LIPID MICROSPHERE
1.0000 mL | INTRAVENOUS | Status: AC | PRN
Start: 2022-09-27 — End: 2022-09-27
  Administered 2022-09-27: 2 mL via INTRAVENOUS

## 2022-10-01 ENCOUNTER — Ambulatory Visit (INDEPENDENT_AMBULATORY_CARE_PROVIDER_SITE_OTHER): Payer: 59 | Admitting: Family Medicine

## 2022-10-01 ENCOUNTER — Encounter: Payer: Self-pay | Admitting: Family Medicine

## 2022-10-01 VITALS — BP 108/64 | HR 86 | Temp 98.7°F | Ht 69.0 in | Wt 252.0 lb

## 2022-10-01 DIAGNOSIS — G4733 Obstructive sleep apnea (adult) (pediatric): Secondary | ICD-10-CM

## 2022-10-01 DIAGNOSIS — Z23 Encounter for immunization: Secondary | ICD-10-CM

## 2022-10-01 DIAGNOSIS — R7401 Elevation of levels of liver transaminase levels: Secondary | ICD-10-CM

## 2022-10-01 DIAGNOSIS — E6609 Other obesity due to excess calories: Secondary | ICD-10-CM | POA: Diagnosis not present

## 2022-10-01 NOTE — Patient Instructions (Signed)
It was nice to see you today,  Please continue to do things we discussed regarding your weight loss.  Try to eat 3 medium size meals a day with minimal snacking in between.  If you do snack try to eat something low calorie such as fruits.  For your meals try to have some protein.  For example for breakfast scrambled eggs or yogurt with fruit.  Please continue wear your CPAP  If you need your blood pressure medications all prescribed through me I can do that.  Just let us know the next time you need a refill.  I will let you know the results of your lab tests when I get them.  Have a great day,  Frederic Jericho, MD

## 2022-10-01 NOTE — Assessment & Plan Note (Signed)
Discussed weight loss with the patient.  Discussed good eating habits including eating scheduled meals 3 times a day, that have protein as well as a fruit or vegetable.  Discussed portion control and mindless eating habits.  Discussed consequences of obesity including metabolic liver disease.

## 2022-10-01 NOTE — Progress Notes (Signed)
   Established Patient Office Visit  Subjective   Patient ID: Roberto Henderson, male    DOB: 04/28/1971  Age: 52 y.o. MRN: [id number]  Chief Complaint  Patient presents with   Discuss Weight   Sleep Apnea   Follow Up Labs    HPI  Patient states he wore his CPAP last night.  Previously left it at fianc's house.  Did not have significant issues using it.  Was able to sleep through the night.  Patient noticed some improvement in urinary symptoms after using his finasteride  Patient had a echocardiogram done today.  We discussed the findings.  He states he had not talked to the cardiologist regarding results yet.  Patient is still having issues with his weight.  Tends to not eat in the morning or eat very little in the morning and afternoon and overeating in the evening.  For breakfast he typically has applesauce or oatmeal for his medications.  Tends to overeat between 5 PM and 8 PM.  Also snacks.  We discussed eating 3 medium sized meals a day with protein and minimal snacking in between.  Discussed mindless eating and using portion control.     ROS    Objective:     BP 108/64   Pulse 86   Temp 98.7 F (37.1 C) (Oral)   Ht 5\' 9"  (1.753 m)   Wt 252 lb (114.3 kg)   SpO2 97%   BMI 37.21 kg/m    Physical Exam General: Alert, oriented Pulmonary: No respiratory distress Psych: Pleasant affect, good eye contact, spontaneous speech.  No results found for any visits on 10/01/22.    The 10-year ASCVD risk score (Arnett DK, et al., 2019) is: 7.3%    Assessment & Plan:   Problem List Items Addressed This Visit       Respiratory   OSA (obstructive sleep apnea)    Started wearing CPAP last night.  Minimal issues using it.  Advised patient to continue using this.        Other   Class 2 obesity due to excess calories without serious comorbidity with body mass index (BMI) of 35.0 to 35.9 in adult    Discussed weight loss with the patient.  Discussed good eating habits  including eating scheduled meals 3 times a day, that have protein as well as a fruit or vegetable.  Discussed portion control and mindless eating habits.  Discussed consequences of obesity including metabolic liver disease.      Elevated ALT measurement - Primary   Relevant Orders   Comp Met (CMET)   Other Visit Diagnoses     Need for shingles vaccine       Relevant Orders   Zoster Recombinant (Shingrix ) (Completed)   Vaccine for diphtheria-tetanus-pertussis, combined       Relevant Orders   Tdap vaccine greater than or equal to 7yo IM (Completed)       Return in about 4 months (around 02/01/2023) for Weight loss.    Sandre Kitty, MD

## 2022-10-01 NOTE — Assessment & Plan Note (Signed)
Started wearing CPAP last night.  Minimal issues using it.  Advised patient to continue using this.

## 2022-10-02 ENCOUNTER — Other Ambulatory Visit: Payer: Self-pay | Admitting: Family Medicine

## 2022-10-02 DIAGNOSIS — N1831 Chronic kidney disease, stage 3a: Secondary | ICD-10-CM

## 2022-10-02 LAB — COMPREHENSIVE METABOLIC PANEL
ALT: 45 IU/L — ABNORMAL HIGH (ref 0–44)
AST: 26 IU/L (ref 0–40)
Albumin/Globulin Ratio: 1.4 (ref 1.2–2.2)
Albumin: 4.4 g/dL (ref 3.8–4.9)
Alkaline Phosphatase: 128 IU/L — ABNORMAL HIGH (ref 44–121)
BUN/Creatinine Ratio: 15 (ref 9–20)
BUN: 25 mg/dL — ABNORMAL HIGH (ref 6–24)
Bilirubin Total: 0.3 mg/dL (ref 0.0–1.2)
CO2: 24 mmol/L (ref 20–29)
Calcium: 9.5 mg/dL (ref 8.7–10.2)
Chloride: 100 mmol/L (ref 96–106)
Creatinine, Ser: 1.63 mg/dL — ABNORMAL HIGH (ref 0.76–1.27)
Globulin, Total: 3.1 g/dL (ref 1.5–4.5)
Glucose: 117 mg/dL — ABNORMAL HIGH (ref 70–99)
Potassium: 4.4 mmol/L (ref 3.5–5.2)
Sodium: 140 mmol/L (ref 134–144)
Total Protein: 7.5 g/dL (ref 6.0–8.5)
eGFR: 50 mL/min/{1.73_m2} — ABNORMAL LOW (ref >59)

## 2022-10-02 MED ORDER — EMPAGLIFLOZIN 10 MG PO TABS
10.0000 mg | ORAL_TABLET | Freq: Every day | ORAL | 2 refills | Status: DC
Start: 2022-10-02 — End: 2023-02-19

## 2022-10-10 ENCOUNTER — Other Ambulatory Visit: Payer: Self-pay | Admitting: Family Medicine

## 2022-10-17 ENCOUNTER — Other Ambulatory Visit: Payer: Self-pay | Admitting: Urology

## 2022-10-17 DIAGNOSIS — N401 Enlarged prostate with lower urinary tract symptoms: Secondary | ICD-10-CM

## 2022-11-21 ENCOUNTER — Encounter: Payer: Self-pay | Admitting: Gastroenterology

## 2022-11-24 ENCOUNTER — Other Ambulatory Visit: Payer: Self-pay | Admitting: Family Medicine

## 2022-11-24 DIAGNOSIS — Z9189 Other specified personal risk factors, not elsewhere classified: Secondary | ICD-10-CM

## 2022-11-26 ENCOUNTER — Other Ambulatory Visit: Payer: Self-pay | Admitting: Family Medicine

## 2022-11-26 DIAGNOSIS — I10 Essential (primary) hypertension: Secondary | ICD-10-CM

## 2022-12-27 ENCOUNTER — Other Ambulatory Visit: Payer: Self-pay | Admitting: Family Medicine

## 2022-12-27 DIAGNOSIS — I10 Essential (primary) hypertension: Secondary | ICD-10-CM

## 2023-02-19 ENCOUNTER — Ambulatory Visit: Payer: BC Managed Care – PPO | Admitting: Family Medicine

## 2023-02-19 VITALS — BP 125/76 | HR 84 | Ht 69.0 in | Wt 249.4 lb

## 2023-02-19 DIAGNOSIS — I10 Essential (primary) hypertension: Secondary | ICD-10-CM | POA: Diagnosis not present

## 2023-02-19 DIAGNOSIS — Z9189 Other specified personal risk factors, not elsewhere classified: Secondary | ICD-10-CM

## 2023-02-19 DIAGNOSIS — R7303 Prediabetes: Secondary | ICD-10-CM

## 2023-02-19 DIAGNOSIS — R7401 Elevation of levels of liver transaminase levels: Secondary | ICD-10-CM

## 2023-02-19 DIAGNOSIS — N401 Enlarged prostate with lower urinary tract symptoms: Secondary | ICD-10-CM

## 2023-02-19 DIAGNOSIS — E782 Mixed hyperlipidemia: Secondary | ICD-10-CM

## 2023-02-19 DIAGNOSIS — R3914 Feeling of incomplete bladder emptying: Secondary | ICD-10-CM

## 2023-02-19 DIAGNOSIS — E66812 Obesity, class 2: Secondary | ICD-10-CM

## 2023-02-19 DIAGNOSIS — G479 Sleep disorder, unspecified: Secondary | ICD-10-CM

## 2023-02-19 DIAGNOSIS — Z23 Encounter for immunization: Secondary | ICD-10-CM | POA: Diagnosis not present

## 2023-02-19 DIAGNOSIS — N1831 Chronic kidney disease, stage 3a: Secondary | ICD-10-CM

## 2023-02-19 DIAGNOSIS — G4733 Obstructive sleep apnea (adult) (pediatric): Secondary | ICD-10-CM

## 2023-02-19 DIAGNOSIS — E6609 Other obesity due to excess calories: Secondary | ICD-10-CM

## 2023-02-19 MED ORDER — ASPIRIN 81 MG PO TBEC: 81.0000 mg | DELAYED_RELEASE_TABLET | Freq: Every day | ORAL | 3 refills | Status: AC

## 2023-02-19 MED ORDER — TELMISARTAN 80 MG PO TABS: ORAL_TABLET | ORAL | 3 refills | Status: AC

## 2023-02-19 MED ORDER — METOPROLOL SUCCINATE ER 50 MG PO TB24: 50.0000 mg | ORAL_TABLET | Freq: Every day | ORAL | 3 refills | Status: AC

## 2023-02-19 MED ORDER — FINASTERIDE 5 MG PO TABS
5.0000 mg | ORAL_TABLET | Freq: Every day | ORAL | 11 refills | Status: DC
Start: 2023-02-19 — End: 2023-12-31

## 2023-02-19 MED ORDER — ALFUZOSIN HCL ER 10 MG PO TB24
10.0000 mg | ORAL_TABLET | Freq: Every day | ORAL | 3 refills | Status: DC
Start: 2023-02-19 — End: 2023-12-31

## 2023-02-19 MED ORDER — HYDROCHLOROTHIAZIDE 25 MG PO TABS: 25.0000 mg | ORAL_TABLET | Freq: Every day | ORAL | 3 refills | Status: AC

## 2023-02-19 MED ORDER — ATORVASTATIN CALCIUM 40 MG PO TABS: 40.0000 mg | ORAL_TABLET | Freq: Every day | ORAL | 3 refills | Status: AC

## 2023-02-19 MED ORDER — EMPAGLIFLOZIN 10 MG PO TABS
10.0000 mg | ORAL_TABLET | Freq: Every day | ORAL | 3 refills | Status: DC
Start: 2023-02-19 — End: 2024-02-04

## 2023-02-19 MED ORDER — AMLODIPINE BESYLATE 10 MG PO TABS: 10.0000 mg | ORAL_TABLET | Freq: Every day | ORAL | 3 refills | Status: AC

## 2023-02-19 MED ORDER — SPIRONOLACTONE 25 MG PO TABS: ORAL_TABLET | ORAL | 3 refills | Status: AC

## 2023-02-19 NOTE — Progress Notes (Unsigned)
Established Patient Office Visit  Subjective   Patient ID: ASAR ESKRA, male    DOB: Oct 03, 1970  Age: 52 y.o. MRN: [id number]  No chief complaint on file.   HPI  Lost insurance - strted new job.  With new insurance he needs prior auth for jardiance as well as other meds?  [facility]    OSA-using CPAP? Some nights without doing.   On a new exercise program.  5 times a week. 1.5 hours a day.  Cardio at work.  Treadmill, conditioning.    Cardiology-hypertension losartan changed to telmisartan, HCTZ increased, amlodipine, Toprol spironolactone.    Trazadone didn't help.  Mind slow down.  Going back to sleep and might be different or if you have tried.  Benadryl makes him drowsy in the morning.    HLD-statin  CKD  Alfuzosin  jardiance  ED-sildenafil 100 mg  Colonoscopy?-[place] in 2021.  Appears he was recommended a repeat in 3 years.  Is due now.  Weight loss - There is no height or weight on file to calculate BMI.   The 10-year ASCVD risk score (Arnett DK, et al., 2019) is: 7.3%  Health Maintenance Due  Topic Date Due   COVID-19 Vaccine (3 - Pfizer risk series) 11/01/2019   INFLUENZA VACCINE  12/12/2022   Colonoscopy  01/19/2023      Objective:     There were no vitals taken for this visit. {Vitals History (Optional):23777}  Physical Exam   No results found for any visits on 02/19/23.      Assessment & Plan:   There are no diagnoses linked to this encounter.   No follow-ups on file.    Sandre Kitty, MD

## 2023-02-19 NOTE — Patient Instructions (Signed)
It was nice to see you today,  We addressed the following topics today: -I have sent in prescriptions for all of your chronic medications to [facility]. - I have ordered lab test for cholesterol, kidney and liver function and A1c.  You can have this done at a [facility] location.  There are multiple [facility] sites that we will draw blood.  The closest one to [place] is on [address] across from the hospital.  You can call them to schedule an appointment for this. - I will see back in 1 month.  At that time you can bring has your CPAP machine and I can go over it with you to make sure you are using it correctly. - For helping to lose weight you can use a calorie counting app such as lose it or cronometer.  There are several free options available.  Your goal should be 1500 to 2000 cal a day.  This will help with weight loss of roughly 1 pound a week. - To help with difficulty sleeping you can try using ambient noise such as white noise or rainfall to help you fall asleep.  You should also try to make sure there are no distracting sounds and keep the room as dark as possible.  You can use sleep masks to help block out ambient light.  Have a great day,  Frederic Jericho, MD

## 2023-02-20 DIAGNOSIS — G479 Sleep disorder, unspecified: Secondary | ICD-10-CM | POA: Insufficient documentation

## 2023-02-20 NOTE — Assessment & Plan Note (Signed)
Patient was not able to fill his most recent Jardiance prescription due to insurance change.  I have resent it to his new online pharmacy [facility].

## 2023-02-20 NOTE — Assessment & Plan Note (Signed)
The patient endorses difficulty falling asleep and also early awakening with difficulty falling back asleep.  Has tried Benadryl in the past, did not tolerate it well.  Has tried trazodone in the past, was ineffective.  Discussed sleep hygiene techniques.  At next visit will discuss pharmacologic treatments if necessary.

## 2023-02-20 NOTE — Assessment & Plan Note (Signed)
Patient partially compliant with CPAP.  Advised him to bring it in next time so that he can go over how to use it properly.  Discussed the importance of CPAP with managing fatigue, hypertension, headaches.

## 2023-02-20 NOTE — Assessment & Plan Note (Signed)
Patient is on an exercise program with his new employer[employer].  He has lost approximately 3 pounds since our last visit.  We discussed the importance of diet with maintaining weight.  Recommended using calorie counting apps.  Discussed goal of less than 2000 cal a day for a weight loss of approximately 1 to 2 pounds per week.  Encouraged continued exercise.

## 2023-02-27 ENCOUNTER — Ambulatory Visit: Payer: 59 | Admitting: Urology

## 2023-03-19 ENCOUNTER — Ambulatory Visit: Payer: BC Managed Care – PPO | Admitting: Family Medicine

## 2023-03-27 ENCOUNTER — Other Ambulatory Visit: Payer: Self-pay | Admitting: Family Medicine

## 2023-03-27 DIAGNOSIS — I10 Essential (primary) hypertension: Secondary | ICD-10-CM

## 2023-04-14 ENCOUNTER — Ambulatory Visit: Payer: BC Managed Care – PPO | Admitting: Family Medicine

## 2023-04-14 NOTE — Progress Notes (Unsigned)
   Established Patient Office Visit  Subjective   Patient ID: Roberto Henderson, male    DOB: Oct 09, 1970  Age: 52 y.o. MRN: [id number]  No chief complaint on file.   HPI  OSA-bring his CPAP?  Colonoscopy-2D call about?  Weight loss-still doing work exercise program?  Patient needs repeat CMP.   The 10-year ASCVD risk score (Arnett DK, et al., 2019) is: 9.6%  Health Maintenance Due  Topic Date Due   COVID-19 Vaccine (3 - 2023-24 season) 01/12/2023   Colonoscopy  01/19/2023      Objective:     There were no vitals taken for this visit. {Vitals History (Optional):23777}  Physical Exam   No results found for any visits on 04/14/23.      Assessment & Plan:   There are no diagnoses linked to this encounter.   No follow-ups on file.    Sandre Kitty, MD

## 2023-04-29 NOTE — Progress Notes (Unsigned)
   Established Patient Office Visit  Subjective   Patient ID: GRANTLAND MUNNS, male    DOB: 1970-06-07  Age: 52 y.o. MRN: [id number]  No chief complaint on file.   HPI  OSA-bring his CPAP?  Colonoscopy-2D call about?  Weight loss-still doing work exercise program?  Patient needs repeat CMP.   The 10-year ASCVD risk score (Arnett DK, et al., 2019) is: 9.6%  Health Maintenance Due  Topic Date Due   COVID-19 Vaccine (3 - 2024-25 season) 01/12/2023   Colonoscopy  01/19/2023      Objective:     There were no vitals taken for this visit. {Vitals History (Optional):23777}  Physical Exam   No results found for any visits on 04/30/23.      Assessment & Plan:   There are no diagnoses linked to this encounter.   No follow-ups on file.    Sandre Kitty, MD

## 2023-04-30 ENCOUNTER — Ambulatory Visit: Payer: BC Managed Care – PPO | Admitting: Family Medicine

## 2023-04-30 ENCOUNTER — Encounter: Payer: Self-pay | Admitting: Family Medicine

## 2023-04-30 VITALS — BP 125/86 | HR 90 | Ht 69.0 in | Wt 248.1 lb

## 2023-04-30 DIAGNOSIS — Z9189 Other specified personal risk factors, not elsewhere classified: Secondary | ICD-10-CM | POA: Diagnosis not present

## 2023-04-30 DIAGNOSIS — G479 Sleep disorder, unspecified: Secondary | ICD-10-CM

## 2023-04-30 DIAGNOSIS — G4733 Obstructive sleep apnea (adult) (pediatric): Secondary | ICD-10-CM

## 2023-04-30 DIAGNOSIS — R7303 Prediabetes: Secondary | ICD-10-CM

## 2023-04-30 DIAGNOSIS — G5721 Lesion of femoral nerve, right lower limb: Secondary | ICD-10-CM | POA: Insufficient documentation

## 2023-04-30 DIAGNOSIS — R7401 Elevation of levels of liver transaminase levels: Secondary | ICD-10-CM

## 2023-04-30 DIAGNOSIS — N1831 Chronic kidney disease, stage 3a: Secondary | ICD-10-CM | POA: Diagnosis not present

## 2023-04-30 MED ORDER — RAMELTEON 8 MG PO TABS: 8.0000 mg | ORAL_TABLET | Freq: Every day | ORAL | 2 refills | Status: DC

## 2023-04-30 NOTE — Assessment & Plan Note (Signed)
Patient has his CPAP machine and has been using it.  No questions or concerns.

## 2023-04-30 NOTE — Patient Instructions (Addendum)
It was nice to see you today,  We addressed the following topics today: -I have sent in a medication called ramelteon for you to take to help with your sleep issues.  Take this about 30 minutes prior to going to bed. - I have ordered some blood test.  We will follow-up with results with you when we get them. - I will send in the referral for the colonoscopy - For your right leg pain, I would recommend doing crutches and exercises.  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  Make sure you wear loosefitting pants to avoid pinching the nerve.  The hip flexor stretch I demonstrated is 1 exercise you can do.  Quadricep stretching is another.  You can search for videos from physical therapist about exercises and stretching for the femoral cutaneous nerve.  For pain relief you can try topical treatments or a TENS unit. - If your right leg pain gets worse or you develop right leg weakness I would go to add orthopedic urgent care such as [facility] urgent [facility].   Have a great day,  Frederic Jericho, MD

## 2023-04-30 NOTE — Assessment & Plan Note (Signed)
Worsening paresthesia numbness in the right anterior thigh that originates at the hip.  No indication of nerve impingement from piriformis or lumbar radiculopathy.  Recommended and demonstrated stretching exercises.  Recommended wearing loosefitting clothing, topical treatment as needed, TENS unit as needed.  Patient has familiarity with TENS units.

## 2023-04-30 NOTE — Assessment & Plan Note (Signed)
Patient has tried and failed trazodone, melatonin, glycine, Benadryl.  Early awakening is his main concern.  Discussed ramelteon.  Patient open to trying this.  Also discussed Orexin inhibitors if ramelteon fails.

## 2023-05-01 LAB — COMPREHENSIVE METABOLIC PANEL
ALT: 63 [IU]/L — ABNORMAL HIGH (ref 0–44)
AST: 29 [IU]/L (ref 0–40)
Albumin: 4.5 g/dL (ref 3.8–4.9)
Alkaline Phosphatase: 123 [IU]/L — ABNORMAL HIGH (ref 44–121)
BUN/Creatinine Ratio: 11 (ref 9–20)
BUN: 19 mg/dL (ref 6–24)
Bilirubin Total: 0.4 mg/dL (ref 0.0–1.2)
CO2: 24 mmol/L (ref 20–29)
Calcium: 9.5 mg/dL (ref 8.7–10.2)
Chloride: 103 mmol/L (ref 96–106)
Creatinine, Ser: 1.72 mg/dL — ABNORMAL HIGH (ref 0.76–1.27)
Globulin, Total: 2.9 g/dL (ref 1.5–4.5)
Glucose: 119 mg/dL — ABNORMAL HIGH (ref 70–99)
Potassium: 4.4 mmol/L (ref 3.5–5.2)
Sodium: 143 mmol/L (ref 134–144)
Total Protein: 7.4 g/dL (ref 6.0–8.5)
eGFR: 47 mL/min/{1.73_m2} — ABNORMAL LOW (ref >59)

## 2023-05-01 LAB — LIPID PANEL
Chol/HDL Ratio: 2.9 {ratio} (ref 0.0–5.0)
Cholesterol, Total: 97 mg/dL — ABNORMAL LOW (ref 100–199)
HDL: 34 mg/dL — ABNORMAL LOW (ref >39)
LDL Chol Calc (NIH): 42 mg/dL (ref 0–99)
Triglycerides: 112 mg/dL (ref 0–149)
VLDL Cholesterol Cal: 21 mg/dL (ref 5–40)

## 2023-05-01 LAB — CYSTATIN C: CYSTATIN C: 1.74 mg/L — ABNORMAL HIGH (ref 0.67–1.14)

## 2023-05-01 LAB — HEMOGLOBIN A1C
Est. average glucose Bld gHb Est-mCnc: 131 mg/dL
Hgb A1c MFr Bld: 6.2 % — ABNORMAL HIGH (ref 4.8–5.6)

## 2023-07-29 ENCOUNTER — Ambulatory Visit: Payer: BC Managed Care – PPO | Admitting: Family Medicine

## 2023-07-29 NOTE — Progress Notes (Deleted)
   Established Patient Office Visit  Subjective   Patient ID: Roberto Henderson, male    DOB: 1970-10-27  Age: 53 y.o. MRN: [id number]  No chief complaint on file.   HPI  Insomnia-early awakening.  Ramelteon started.  Consider doxepin?  Right thigh numbness and tingling   The ASCVD Risk score (Arnett DK, et al., 2019) failed to calculate for the following reasons:   The valid total cholesterol range is 130 to 320 mg/dL  Health Maintenance Due  Topic Date Due   Pneumococcal Vaccine 72-5 Years old (1 of 2 - PCV) Never done   COVID-19 Vaccine (3 - 2024-25 season) 01/12/2023   Colonoscopy  01/19/2023      Objective:     There were no vitals taken for this visit. {Vitals History (Optional):23777}  Physical Exam   No results found for any visits on 07/29/23.      Assessment & Plan:   There are no diagnoses linked to this encounter.   No follow-ups on file.    Sandre Kitty, MD

## 2023-08-31 ENCOUNTER — Other Ambulatory Visit: Payer: Self-pay | Admitting: Urology

## 2023-09-01 NOTE — Telephone Encounter (Signed)
 Called patient and lvm to call office to schedule appointment.

## 2023-10-31 ENCOUNTER — Other Ambulatory Visit: Payer: Self-pay | Admitting: Urology

## 2023-10-31 DIAGNOSIS — N529 Male erectile dysfunction, unspecified: Secondary | ICD-10-CM

## 2023-11-28 ENCOUNTER — Encounter: Payer: Self-pay | Admitting: Advanced Practice Midwife

## 2023-12-09 NOTE — Procedures (Signed)
Result scanned to media

## 2023-12-31 ENCOUNTER — Encounter: Payer: Self-pay | Admitting: Urology

## 2023-12-31 ENCOUNTER — Ambulatory Visit (INDEPENDENT_AMBULATORY_CARE_PROVIDER_SITE_OTHER): Admitting: Urology

## 2023-12-31 VITALS — BP 101/64 | HR 92 | Ht 69.0 in | Wt 239.8 lb

## 2023-12-31 DIAGNOSIS — N529 Male erectile dysfunction, unspecified: Secondary | ICD-10-CM | POA: Diagnosis not present

## 2023-12-31 DIAGNOSIS — R3914 Feeling of incomplete bladder emptying: Secondary | ICD-10-CM

## 2023-12-31 DIAGNOSIS — Z125 Encounter for screening for malignant neoplasm of prostate: Secondary | ICD-10-CM | POA: Diagnosis not present

## 2023-12-31 DIAGNOSIS — N401 Enlarged prostate with lower urinary tract symptoms: Secondary | ICD-10-CM

## 2023-12-31 MED ORDER — TADALAFIL 20 MG PO TABS: 10.0000 mg | ORAL_TABLET | Freq: Every day | ORAL | 3 refills | Status: AC | PRN

## 2023-12-31 MED ORDER — FINASTERIDE 5 MG PO TABS: 5.0000 mg | ORAL_TABLET | Freq: Every day | ORAL | 11 refills | Status: AC

## 2023-12-31 MED ORDER — ALFUZOSIN HCL ER 10 MG PO TB24: 10.0000 mg | ORAL_TABLET | Freq: Every day | ORAL | 3 refills | Status: AC

## 2023-12-31 NOTE — Progress Notes (Signed)
   12/31/2023 2:55 PM   Roberto Henderson 06-23-1970 [id number]  Reason for visit: Follow up BPH/LUTS, ED, PSA screening  HPI: 53 year old male we have followed for the above issues.  Bladder symptoms primarily weak stream and urinary frequency, symptoms relatively well-controlled on alfuzosin  and finasteride (started April 2024).  He is at the point where he is interested in considering outlet procedures in the future, prior cystoscopy showed a moderate to large prostate with high bladder neck and lateral lobe hypertrophy and mild trabeculations.  Has not undergone TRUS or prostate volume sizing.  He also has ED relatively well-controlled on Cialis  20 mg on demand.  He felt the Cialis  worked better than sildenafil .  In terms of PSA screening, PSA levels have been normal, most recently 1.7 from April 2024, which was prior to starting finasteride .  He denies any major changes over the last year, he is more interested in considering outlet procedures.  He would like to set up a TRUS in February 2026 and discuss more about UroLift or HOLEP to potentially get off alfuzosin  and finasteride .  Risks and benefits of PSA screening were discussed, can continue screening every other year.  Cialis  20 mg refilled Alfuzosin  and finasteride  refilled RTC February 2026 per patient request for TRUS and discussed outlet procedures UroLift versus HoLEP    Roberto JAYSON Burnet, MD  [facility] [address], [address] [place], [place] [address] [phone]

## 2024-02-02 ENCOUNTER — Other Ambulatory Visit: Payer: Self-pay | Admitting: Family Medicine

## 2024-02-02 DIAGNOSIS — N1831 Chronic kidney disease, stage 3a: Secondary | ICD-10-CM

## 2024-02-02 DIAGNOSIS — E782 Mixed hyperlipidemia: Secondary | ICD-10-CM

## 2024-02-02 DIAGNOSIS — I10 Essential (primary) hypertension: Secondary | ICD-10-CM

## 2024-02-04 ENCOUNTER — Other Ambulatory Visit: Payer: Self-pay | Admitting: Family Medicine

## 2024-02-04 DIAGNOSIS — N1831 Chronic kidney disease, stage 3a: Secondary | ICD-10-CM

## 2024-02-04 MED ORDER — EMPAGLIFLOZIN 10 MG PO TABS
10.0000 mg | ORAL_TABLET | Freq: Every day | ORAL | 3 refills | Status: AC
Start: 2024-02-04 — End: ?

## 2024-02-06 ENCOUNTER — Ambulatory Visit (INDEPENDENT_AMBULATORY_CARE_PROVIDER_SITE_OTHER): Admitting: Family Medicine

## 2024-02-06 VITALS — BP 131/71 | HR 55 | Ht 69.0 in | Wt 239.4 lb

## 2024-02-06 DIAGNOSIS — Z1211 Encounter for screening for malignant neoplasm of colon: Secondary | ICD-10-CM

## 2024-02-06 DIAGNOSIS — Z1212 Encounter for screening for malignant neoplasm of rectum: Secondary | ICD-10-CM

## 2024-02-06 DIAGNOSIS — Z0000 Encounter for general adult medical examination without abnormal findings: Secondary | ICD-10-CM

## 2024-02-06 DIAGNOSIS — G4700 Insomnia, unspecified: Secondary | ICD-10-CM

## 2024-02-06 MED ORDER — DOXEPIN HCL 6 MG PO TABS: 1.0000 | ORAL_TABLET | Freq: Every day | ORAL | 2 refills | Status: DC

## 2024-02-06 NOTE — Patient Instructions (Signed)
 It was nice to see you today,  We addressed the following topics today: -I am sending in a referral for a colonoscopy.  Someone will call you to schedule this. - Prior to your next visit we will get lab results and discuss some at your physical. - I will send in a medication called doxepin  to help with your sleep.  I will send this into [facility].  Take it nightly, 30 minutes to an hour before bedtime.  There is some information on this medication in this packet.   Have a great day,  Rolan Slain, MD

## 2024-02-06 NOTE — Progress Notes (Signed)
   Established Patient Office Visit  Subjective   Patient ID: Roberto Henderson, male    DOB: 1970-12-16  Age: 53 y.o. MRN: [id number]  Chief Complaint  Patient presents with   Medical Management of Chronic Issues    HPI  Subjective - Insomnia: Reports difficulty with sleep initiation and maintenance. Unresponsive to previous trials of ramelteon , melatonin, and L-glycine. Continues to use CPAP machine.  Medications Current medications include telmisartan , metoprolol  once a day, spironolactone  once a day, hydrochlorothiazide  once a day, amlodipine  once a day, atorvastatin  once a day, Jardiance , finasteride , and tamsulosin . Using [facility] for chronic prescriptions.  PMH, PSH, FH, Social Hx PMHx: Hypertension, hyperlipidemia, type 2 diabetes, BPH. PSH: Prior colonoscopy. Social Hx: Works for [employer]. Participates in an exercise program approximately twice a week.  ROS Constitutional: No new concerns other than sleep.   The ASCVD Risk score (Arnett DK, et al., 2019) failed to calculate for the following reasons:   The valid total cholesterol range is 130 to 320 mg/dL  Health Maintenance Due  Topic Date Due   Pneumococcal Vaccine: 50+ Years (1 of 2 - PCV) Never done   Hepatitis B Vaccines 19-59 Average Risk (1 of 3 - 19+ 3-dose series) Never done   Colonoscopy  01/19/2023   COVID-19 Vaccine (3 - 2025-26 season) 01/12/2024      Objective:     BP 131/71   Pulse (!) 55   Ht 5' 9 (1.753 m)   Wt 239 lb 6.4 oz (108.6 kg)   SpO2 96%   BMI 35.35 kg/m    Physical Exam Gen: alert, oriented Pulm: no respiratory distress Psych: pleasant affect   No results found for any visits on 02/06/24.      Assessment & Plan:   Insomnia, unspecified type Assessment & Plan: Patient presents with chronic insomnia, characterized by difficulty with sleep initiation and maintenance. Has failed trials of ramelteon , melatonin, and L-glycine. Continues CPAP use. - Start doxepin  10 mg  nightly. Sent to [facility]. Counseled on taking 30-60 minutes before bedtime and that it is not for as-needed use. Discussed potential side effects are minimal at this dose but can include daytime somnolence. Discussed risks/benefits of alternative sleep aids including Z-drugs (e.g., Ambien, Lunesta) such as dependence, daytime somnolence, and parasomnias, and orexin inhibitors which may be an option if doxepin  fails.   Encounter for colorectal cancer screening -     Ambulatory referral to Gastroenterology  Healthcare maintenance Assessment & Plan: Patient is due for annual physical and routine lab monitoring. - Schedule for annual physical in approximately one month. - Plan for lab work one week prior to physical, to include CMP, lipid panel, A1C, and PSA. - Referral placed for follow-up colonoscopy. Patient will be contacted for scheduling. Will follow up on status at next visit if no contact is made.   Other orders -     Doxepin  HCl; Take 1 tablet (6 mg total) by mouth at bedtime.  Dispense: 30 tablet; Refill: 2     Return in about 4 weeks (around 03/05/2024) for physical.    Roberto MARLA Slain, MD

## 2024-02-07 ENCOUNTER — Encounter: Payer: Self-pay | Admitting: Family Medicine

## 2024-02-07 DIAGNOSIS — G4700 Insomnia, unspecified: Secondary | ICD-10-CM | POA: Insufficient documentation

## 2024-02-07 DIAGNOSIS — Z0000 Encounter for general adult medical examination without abnormal findings: Secondary | ICD-10-CM | POA: Insufficient documentation

## 2024-02-07 NOTE — Assessment & Plan Note (Signed)
 Patient presents with chronic insomnia, characterized by difficulty with sleep initiation and maintenance. Has failed trials of ramelteon , melatonin, and L-glycine. Continues CPAP use. - Start doxepin  10 mg nightly. Sent to [facility]. Counseled on taking 30-60 minutes before bedtime and that it is not for as-needed use. Discussed potential side effects are minimal at this dose but can include daytime somnolence. Discussed risks/benefits of alternative sleep aids including Z-drugs (e.g., Ambien, Lunesta) such as dependence, daytime somnolence, and parasomnias, and orexin inhibitors which may be an option if doxepin  fails.

## 2024-02-07 NOTE — Assessment & Plan Note (Signed)
 Patient is due for annual physical and routine lab monitoring. - Schedule for annual physical in approximately one month. - Plan for lab work one week prior to physical, to include CMP, lipid panel, A1C, and PSA. - Referral placed for follow-up colonoscopy. Patient will be contacted for scheduling. Will follow up on status at next visit if no contact is made.

## 2024-02-10 ENCOUNTER — Other Ambulatory Visit: Payer: Self-pay | Admitting: Family Medicine

## 2024-02-10 MED ORDER — DOXEPIN HCL 10 MG PO CAPS: 10.0000 mg | ORAL_CAPSULE | Freq: Every day | ORAL | 2 refills | Status: AC

## 2024-02-25 ENCOUNTER — Other Ambulatory Visit

## 2024-02-25 ENCOUNTER — Other Ambulatory Visit: Payer: Self-pay | Admitting: Family Medicine

## 2024-02-25 DIAGNOSIS — R7401 Elevation of levels of liver transaminase levels: Secondary | ICD-10-CM

## 2024-02-25 DIAGNOSIS — N1831 Chronic kidney disease, stage 3a: Secondary | ICD-10-CM

## 2024-02-25 DIAGNOSIS — R739 Hyperglycemia, unspecified: Secondary | ICD-10-CM

## 2024-02-25 DIAGNOSIS — E66812 Obesity, class 2: Secondary | ICD-10-CM

## 2024-02-25 DIAGNOSIS — Z125 Encounter for screening for malignant neoplasm of prostate: Secondary | ICD-10-CM

## 2024-02-25 DIAGNOSIS — I10 Essential (primary) hypertension: Secondary | ICD-10-CM

## 2024-02-25 DIAGNOSIS — E782 Mixed hyperlipidemia: Secondary | ICD-10-CM

## 2024-03-02 ENCOUNTER — Encounter: Admitting: Family Medicine

## 2024-06-09 ENCOUNTER — Encounter: Admitting: Urology

## 2024-06-16 ENCOUNTER — Encounter: Admitting: Urology
